# Patient Record
Sex: Female | Born: 1965 | Race: White | Hispanic: Yes | Smoking: Never smoker
Health system: Southern US, Community
[De-identification: ages and names within clinical notes are randomized; demographics above are authoritative.]

## PROBLEM LIST (undated history)

## (undated) DIAGNOSIS — E785 Hyperlipidemia, unspecified: Secondary | ICD-10-CM

## (undated) DIAGNOSIS — R030 Elevated blood-pressure reading, without diagnosis of hypertension: Principal | ICD-10-CM

## (undated) DIAGNOSIS — D649 Anemia, unspecified: Secondary | ICD-10-CM

## (undated) HISTORY — DX: Elevated blood-pressure reading, without diagnosis of hypertension: R03.0

## (undated) HISTORY — PX: TUBAL LIGATION: SHX77

## (undated) HISTORY — PX: LITHOTRIPSY: SUR834

## (undated) HISTORY — DX: Hyperlipidemia, unspecified: E78.5

## (undated) HISTORY — DX: Anemia, unspecified: D64.9

---

## 2004-11-01 ENCOUNTER — Emergency Department (HOSPITAL_COMMUNITY): Admission: EM | Admit: 2004-11-01 | Discharge: 2004-11-01 | Payer: Self-pay | Admitting: Emergency Medicine

## 2004-12-04 ENCOUNTER — Ambulatory Visit: Payer: Self-pay | Admitting: Gastroenterology

## 2005-01-16 ENCOUNTER — Ambulatory Visit: Payer: Self-pay | Admitting: Gastroenterology

## 2005-01-16 ENCOUNTER — Encounter (INDEPENDENT_AMBULATORY_CARE_PROVIDER_SITE_OTHER): Payer: Self-pay | Admitting: Specialist

## 2006-12-02 ENCOUNTER — Encounter (INDEPENDENT_AMBULATORY_CARE_PROVIDER_SITE_OTHER): Payer: Self-pay | Admitting: Nurse Practitioner

## 2006-12-02 ENCOUNTER — Ambulatory Visit: Payer: Self-pay | Admitting: Internal Medicine

## 2006-12-02 LAB — CONVERTED CEMR LAB
ALT: <8 units/L (ref 0–35)
AST: 13 units/L (ref 0–37)
Albumin: 4.5 g/dL (ref 3.5–5.2)
Alkaline Phosphatase: 83 units/L (ref 39–117)
BUN: 9 mg/dL (ref 6–23)
Basophils Absolute: 0 10*3/uL (ref 0.0–0.1)
Basophils Relative: 1 % (ref 0–1)
CO2: 21 meq/L (ref 19–32)
Calcium: 9.4 mg/dL (ref 8.4–10.5)
Chloride: 106 meq/L (ref 96–112)
Creatinine, Ser: 0.6 mg/dL (ref 0.40–1.20)
Eosinophils Absolute: 0.2 10*3/uL (ref 0.2–0.7)
Eosinophils Relative: 4 % (ref 0–5)
Glucose, Bld: 88 mg/dL (ref 70–99)
HCT: 36.8 % (ref 36.0–46.0)
Helicobacter Pylori Antibody-IgG: 5.5 — ABNORMAL HIGH
Hemoglobin: 12 g/dL (ref 12.0–15.0)
Lymphocytes Relative: 32 % (ref 12–46)
Lymphs Abs: 1.9 10*3/uL (ref 0.7–4.0)
MCHC: 32.6 g/dL (ref 30.0–36.0)
MCV: 81.1 fL (ref 78.0–100.0)
Monocytes Absolute: 0.3 10*3/uL (ref 0.1–1.0)
Monocytes Relative: 4 % (ref 3–12)
Neutro Abs: 3.5 10*3/uL (ref 1.7–7.7)
Neutrophils Relative %: 60 % (ref 43–77)
Platelets: 369 10*3/uL (ref 150–400)
Potassium: 4.2 meq/L (ref 3.5–5.3)
Preg, Serum: NEGATIVE
RBC: 4.54 M/uL (ref 3.87–5.11)
RDW: 12.7 % (ref 11.5–15.5)
Sodium: 140 meq/L (ref 135–145)
TSH: 1.564 microintl units/mL (ref 0.350–5.50)
Total Bilirubin: 0.6 mg/dL (ref 0.3–1.2)
Total Protein: 7.8 g/dL (ref 6.0–8.3)
WBC: 5.8 10*3/uL (ref 4.0–10.5)

## 2006-12-05 ENCOUNTER — Emergency Department (HOSPITAL_COMMUNITY): Admission: EM | Admit: 2006-12-05 | Discharge: 2006-12-05 | Payer: Self-pay | Admitting: Emergency Medicine

## 2006-12-21 ENCOUNTER — Ambulatory Visit (HOSPITAL_COMMUNITY): Admission: RE | Admit: 2006-12-21 | Discharge: 2006-12-21 | Payer: Self-pay | Admitting: Internal Medicine

## 2006-12-23 ENCOUNTER — Ambulatory Visit: Payer: Self-pay | Admitting: *Deleted

## 2007-02-23 ENCOUNTER — Encounter (INDEPENDENT_AMBULATORY_CARE_PROVIDER_SITE_OTHER): Payer: Self-pay | Admitting: Nurse Practitioner

## 2007-02-23 ENCOUNTER — Ambulatory Visit: Payer: Self-pay | Admitting: Family Medicine

## 2007-04-14 ENCOUNTER — Ambulatory Visit: Payer: Self-pay | Admitting: Internal Medicine

## 2007-04-22 ENCOUNTER — Ambulatory Visit: Payer: Self-pay | Admitting: Internal Medicine

## 2007-05-11 ENCOUNTER — Ambulatory Visit: Payer: Self-pay | Admitting: Internal Medicine

## 2007-10-06 ENCOUNTER — Ambulatory Visit: Payer: Self-pay | Admitting: Internal Medicine

## 2008-03-22 ENCOUNTER — Ambulatory Visit: Payer: Self-pay | Admitting: Internal Medicine

## 2008-03-28 ENCOUNTER — Ambulatory Visit (HOSPITAL_COMMUNITY): Admission: RE | Admit: 2008-03-28 | Discharge: 2008-03-28 | Payer: Self-pay | Admitting: Internal Medicine

## 2010-06-07 ENCOUNTER — Other Ambulatory Visit (HOSPITAL_COMMUNITY): Payer: Self-pay | Admitting: Family Medicine

## 2010-06-07 ENCOUNTER — Other Ambulatory Visit: Payer: Self-pay | Admitting: Family Medicine

## 2010-06-07 ENCOUNTER — Other Ambulatory Visit (HOSPITAL_COMMUNITY): Admission: RE | Admit: 2010-06-07 | Payer: Self-pay | Source: Ambulatory Visit

## 2010-06-07 DIAGNOSIS — Z1231 Encounter for screening mammogram for malignant neoplasm of breast: Secondary | ICD-10-CM

## 2010-06-19 ENCOUNTER — Ambulatory Visit (HOSPITAL_COMMUNITY): Payer: Self-pay

## 2010-07-02 ENCOUNTER — Ambulatory Visit (HOSPITAL_COMMUNITY): Payer: Self-pay

## 2010-07-18 ENCOUNTER — Ambulatory Visit (HOSPITAL_COMMUNITY): Payer: Self-pay

## 2010-08-01 ENCOUNTER — Ambulatory Visit (HOSPITAL_COMMUNITY)
Admission: RE | Admit: 2010-08-01 | Discharge: 2010-08-01 | Disposition: A | Discharge status: Home / Self Care | Payer: Self-pay | Source: Ambulatory Visit | Attending: Family Medicine | Admitting: Family Medicine

## 2010-08-01 DIAGNOSIS — Z1231 Encounter for screening mammogram for malignant neoplasm of breast: Secondary | ICD-10-CM | POA: Insufficient documentation

## 2010-10-22 LAB — BASIC METABOLIC PANEL
BUN: 10
CO2: 30
Calcium: 9
Chloride: 103
Creatinine, Ser: 0.61
GFR calc Af Amer: >60
GFR calc non Af Amer: >60
Glucose, Bld: 105 — ABNORMAL HIGH
Potassium: 3.4 — ABNORMAL LOW
Sodium: 138

## 2010-10-22 LAB — URINE MICROSCOPIC-ADD ON

## 2010-10-22 LAB — URINALYSIS, ROUTINE W REFLEX MICROSCOPIC
Bilirubin Urine: NEGATIVE
Glucose, UA: NEGATIVE
Ketones, ur: NEGATIVE
Leukocytes, UA: NEGATIVE
Nitrite: NEGATIVE
Protein, ur: NEGATIVE
Specific Gravity, Urine: 1.029
Urobilinogen, UA: 0.2
pH: 6

## 2010-10-22 LAB — PREGNANCY, URINE: Preg Test, Ur: NEGATIVE

## 2011-02-10 ENCOUNTER — Emergency Department (INDEPENDENT_AMBULATORY_CARE_PROVIDER_SITE_OTHER)
Admission: EM | Admit: 2011-02-10 | Discharge: 2011-02-10 | Disposition: A | Discharge status: Home / Self Care | Payer: Self-pay | Source: Home / Self Care | Attending: Emergency Medicine | Admitting: Emergency Medicine

## 2011-02-10 ENCOUNTER — Encounter (HOSPITAL_COMMUNITY): Payer: Self-pay | Admitting: Emergency Medicine

## 2011-02-10 DIAGNOSIS — K5900 Constipation, unspecified: Secondary | ICD-10-CM

## 2011-02-10 DIAGNOSIS — R109 Unspecified abdominal pain: Secondary | ICD-10-CM

## 2011-02-10 MED ORDER — OMEPRAZOLE 20 MG PO CPDR: 40.0000 mg | DELAYED_RELEASE_CAPSULE | Freq: Every day | ORAL | Status: DC

## 2011-02-10 MED ORDER — POLYETHYLENE GLYCOL 3350 17 G PO PACK: 17.0000 g | PACK | Freq: Every day | ORAL | Status: AC

## 2011-02-10 MED ORDER — POLYETHYLENE GLYCOL 3350 17 G PO PACK: 17.0000 g | PACK | Freq: Every day | ORAL | Status: DC

## 2011-02-10 NOTE — ED Provider Notes (Signed)
History     CSN: [id number]  Arrival date & time 02/10/11  1721   First MD Initiated Contact with Patient 02/10/11 1722      Chief Complaint  Patient presents with  . Abdominal Pain    (Consider location/radiation/quality/duration/timing/severity/associated sxs/prior treatment) HPI Comments: Been having stomach pain on and off, for several weeks" also with hard stools last BM was yesterday, the pain comes and goes, been taking some Motrins for my shoulder pain" feeling nauseous when I eat, no vomiting, no diarrheas  Patient is a 46 y.o. female presenting with abdominal pain. The history is provided by the patient.  Abdominal Pain The primary symptoms of the illness include abdominal pain and nausea. The primary symptoms of the illness do not include fever, fatigue, shortness of breath, vomiting, diarrhea or dysuria. The current episode started more than 2 days ago. The onset of the illness was sudden. The problem has not changed since onset. The patient states that she believes she is currently not pregnant. The patient has not had a change in bowel habit. Additional symptoms associated with the illness include anorexia and constipation. Symptoms associated with the illness do not include diaphoresis, heartburn, hematuria, frequency or back pain. Significant associated medical issues include GERD. Significant associated medical issues do not include inflammatory bowel disease.    History reviewed. No pertinent past medical history.  History reviewed. No pertinent past surgical history.  No family history on file.  History  Substance Use Topics  . Smoking status: Not on file  . Smokeless tobacco: Not on file  . Alcohol Use: Not on file    OB History    Grav Para Term Preterm Abortions TAB SAB Ect Mult Living                  Review of Systems  Constitutional: Positive for appetite change. Negative for fever, diaphoresis and fatigue.  Respiratory: Negative for shortness of  breath.   Gastrointestinal: Positive for nausea, abdominal pain, constipation and anorexia. Negative for heartburn, vomiting and diarrhea.  Genitourinary: Negative for dysuria, frequency and hematuria.  Musculoskeletal: Negative for back pain.    Allergies  Review of patient's allergies indicates no known allergies.  Home Medications   Current Outpatient Rx  Name Route Sig Dispense Refill  . OMEPRAZOLE 20 MG PO CPDR Oral Take 2 capsules (40 mg total) by mouth daily. 30 capsule 0  . POLYETHYLENE GLYCOL 3350 PO PACK Oral Take 17 g by mouth daily. X 5 nights 14 each 0    BP 154/82  Pulse 66  Temp(Src) 98.3 F (36.8 C) (Oral)  Resp 16  SpO2 100%  LMP 02/10/2011  Physical Exam  Constitutional: She appears well-developed and well-nourished. No distress.  HENT:  Mouth/Throat: No oropharyngeal exudate.  Eyes: No scleral icterus.  Neck: Neck supple.  Abdominal: Soft. Bowel sounds are normal. She exhibits no shifting dullness, no distension and no mass. There is no hepatosplenomegaly or hepatomegaly. There is generalized tenderness. There is no rigidity, no rebound, no guarding, no CVA tenderness, no tenderness at McBurney's point and negative Murphy's sign. No hernia.  Skin: Skin is warm. No erythema.    ED Course  Procedures (including critical care time)  Labs Reviewed - No data to display No results found.   1. Abdominal pain   2. Constipated       MDM  ABDOMINAL PIAN X 5 DAYS, EPIGASTRIC AND CONSTIPATED- ADMITS TO TAKEN MOTRIN 800 FOR SHOULDER PAIN- ALSO WITH CONSTIPATION- SOFT ABDOMEN AND  AFEBRILE.        Jimmie Molly, MD 02/10/11 [phone]

## 2011-02-10 NOTE — ED Notes (Signed)
Pt having stomach pain that mostly appears epigastric. She states she feels full from breakfast still and it makes her nauseas. She has had stomach problems in the past for which she had an endoscopy done. Last BM was yesterday. She is currently menstruating.

## 2011-02-15 ENCOUNTER — Encounter (HOSPITAL_COMMUNITY): Payer: Self-pay | Admitting: Family Medicine

## 2011-02-15 ENCOUNTER — Emergency Department (HOSPITAL_COMMUNITY)
Admission: EM | Admit: 2011-02-15 | Discharge: 2011-02-15 | Disposition: A | Discharge status: Home / Self Care | Payer: Self-pay | Attending: Emergency Medicine | Admitting: Emergency Medicine

## 2011-02-15 ENCOUNTER — Emergency Department (HOSPITAL_COMMUNITY): Payer: Self-pay

## 2011-02-15 DIAGNOSIS — R630 Anorexia: Secondary | ICD-10-CM | POA: Insufficient documentation

## 2011-02-15 DIAGNOSIS — R1013 Epigastric pain: Secondary | ICD-10-CM | POA: Insufficient documentation

## 2011-02-15 DIAGNOSIS — K2970 Gastritis, unspecified, without bleeding: Secondary | ICD-10-CM | POA: Insufficient documentation

## 2011-02-15 DIAGNOSIS — R10816 Epigastric abdominal tenderness: Secondary | ICD-10-CM | POA: Insufficient documentation

## 2011-02-15 DIAGNOSIS — K2990 Gastroduodenitis, unspecified, without bleeding: Secondary | ICD-10-CM | POA: Insufficient documentation

## 2011-02-15 DIAGNOSIS — R112 Nausea with vomiting, unspecified: Secondary | ICD-10-CM | POA: Insufficient documentation

## 2011-02-15 DIAGNOSIS — K5900 Constipation, unspecified: Secondary | ICD-10-CM | POA: Insufficient documentation

## 2011-02-15 LAB — CBC
HCT: 36 % (ref 36.0–46.0)
Hemoglobin: 12.1 g/dL (ref 12.0–15.0)
MCH: 26.5 pg (ref 26.0–34.0)
MCHC: 33.6 g/dL (ref 30.0–36.0)
Platelets: 321 10*3/uL (ref 150–400)
RBC: 4.57 MIL/uL (ref 3.87–5.11)
RDW: 12.5 % (ref 11.5–15.5)
WBC: 7.5 10*3/uL (ref 4.0–10.5)

## 2011-02-15 LAB — URINALYSIS, MICROSCOPIC ONLY
Glucose, UA: NEGATIVE mg/dL
Ketones, ur: NEGATIVE mg/dL
Leukocytes, UA: NEGATIVE
Nitrite: NEGATIVE
Specific Gravity, Urine: 1.021 (ref 1.005–1.030)

## 2011-02-15 LAB — DIFFERENTIAL
Basophils Absolute: 0 10*3/uL (ref 0.0–0.1)
Basophils Relative: 0 % (ref 0–1)
Eosinophils Relative: 6 % — ABNORMAL HIGH (ref 0–5)
Lymphocytes Relative: 38 % (ref 12–46)
Lymphs Abs: 2.8 10*3/uL (ref 0.7–4.0)
Monocytes Absolute: 0.5 10*3/uL (ref 0.1–1.0)
Monocytes Relative: 6 % (ref 3–12)
Neutro Abs: 3.8 10*3/uL (ref 1.7–7.7)
Neutrophils Relative %: 50 % (ref 43–77)

## 2011-02-15 LAB — COMPREHENSIVE METABOLIC PANEL
ALT: 5 U/L (ref 0–35)
AST: 16 U/L (ref 0–37)
Albumin: 3.5 g/dL (ref 3.5–5.2)
BUN: 11 mg/dL (ref 6–23)
CO2: 23 mEq/L (ref 19–32)
Calcium: 9.2 mg/dL (ref 8.4–10.5)
Chloride: 102 mEq/L (ref 96–112)
GFR calc Af Amer: >90 mL/min (ref >90)
GFR calc non Af Amer: >90 mL/min (ref >90)
Potassium: 3.5 mEq/L (ref 3.5–5.1)
Sodium: 136 mEq/L (ref 135–145)
Total Bilirubin: 0.4 mg/dL (ref 0.3–1.2)
Total Protein: 7.7 g/dL (ref 6.0–8.3)

## 2011-02-15 LAB — LIPASE, BLOOD: Lipase: 31 U/L (ref 11–59)

## 2011-02-15 MED ORDER — FAMOTIDINE IN NACL 20-0.9 MG/50ML-% IV SOLN
20.0000 mg | Freq: Once | INTRAVENOUS | Status: AC
  Administered 2011-02-15: 20 mg via INTRAVENOUS
  Filled 2011-02-15: qty 50

## 2011-02-15 MED ORDER — PROMETHAZINE HCL 25 MG PO TABS: 25.0000 mg | ORAL_TABLET | Freq: Four times a day (QID) | ORAL | Status: AC | PRN

## 2011-02-15 MED ORDER — ONDANSETRON HCL 4 MG/2ML IJ SOLN
4.0000 mg | Freq: Once | INTRAMUSCULAR | Status: AC
  Administered 2011-02-15: 4 mg via INTRAVENOUS
  Filled 2011-02-15: qty 2

## 2011-02-15 MED ORDER — MAGNESIUM CITRATE PO SOLN
1.0000 | Freq: Once | ORAL | Status: DC
  Filled 2011-02-15: qty 296

## 2011-02-15 MED ORDER — SODIUM CHLORIDE 0.9 % IV BOLUS (SEPSIS)
1000.0000 mL | Freq: Once | INTRAVENOUS | Status: AC
  Administered 2011-02-15: 1000 mL via INTRAVENOUS

## 2011-02-15 NOTE — ED Notes (Signed)
Pt complaining of abdominal pain for 2 weeks. sts vomiting everytime she eats. sts feels like food wont pass.

## 2011-02-15 NOTE — ED Provider Notes (Signed)
History     CSN: [id number]  Arrival date & time 02/15/11  1500   First MD Initiated Contact with Patient 02/15/11 1540      Chief Complaint  Patient presents with  . Abdominal Pain    (Consider location/radiation/quality/duration/timing/severity/associated sxs/prior treatment) Patient is a 46 y.o. female presenting with abdominal pain. The history is provided by the patient.  Abdominal Pain The primary symptoms of the illness include abdominal pain, nausea and vomiting. The primary symptoms of the illness do not include shortness of breath, diarrhea, dysuria, vaginal discharge or vaginal bleeding. Episode onset: 2 weeks ago. The onset of the illness was gradual. The problem has been gradually worsening.  The abdominal pain is located in the epigastric region. The abdominal pain does not radiate. The severity of the abdominal pain is 4/10. The abdominal pain is relieved by nothing. The abdominal pain is exacerbated by eating.  The vomiting began yesterday. Vomiting occurred once. The emesis contains stomach contents.  The illness is associated with eating. The patient states that she believes she is currently not pregnant. The patient has had a change in bowel habit. Additional symptoms associated with the illness include anorexia and constipation. Significant associated medical issues do not include PUD, GERD, gallstones, liver disease, substance abuse or cardiac disease.    History reviewed. No pertinent past medical history.  Past Surgical History  Procedure Date  . Cesarean section     History reviewed. No pertinent family history.  History  Substance Use Topics  . Smoking status: Never Smoker   . Smokeless tobacco: Not on file  . Alcohol Use: No    OB History    Grav Para Term Preterm Abortions TAB SAB Ect Mult Living                  Review of Systems  Respiratory: Negative for shortness of breath.   Gastrointestinal: Positive for nausea, vomiting, abdominal pain,  constipation and anorexia. Negative for diarrhea.  Genitourinary: Negative for dysuria, vaginal bleeding and vaginal discharge.  All other systems reviewed and are negative.    Allergies  Review of patient's allergies indicates no known allergies.  Home Medications   Current Outpatient Rx  Name Route Sig Dispense Refill  . OMEPRAZOLE 20 MG PO CPDR Oral Take 2 capsules (40 mg total) by mouth daily. 30 capsule 0    BP 144/84  Pulse 74  Temp(Src) 98.2 F (36.8 C) (Oral)  Resp 18  SpO2 100%  LMP 02/10/2011  Physical Exam  Nursing note and vitals reviewed. Constitutional: She is oriented to person, place, and time. She appears well-developed and well-nourished. She appears distressed.  HENT:  Head: Normocephalic and atraumatic.  Mouth/Throat: Oropharynx is clear and moist.  Eyes: EOM are normal. Pupils are equal, round, and reactive to light.  Cardiovascular: Normal rate, regular rhythm, normal heart sounds and intact distal pulses.  Exam reveals no friction rub.   No murmur heard. Pulmonary/Chest: Effort normal and breath sounds normal. She has no wheezes. She has no rales.  Abdominal: Soft. Bowel sounds are normal. She exhibits no distension. There is tenderness in the epigastric area. There is no rebound, no guarding and no CVA tenderness.       Mild epigastric tenderness with no rebound or guarding  Musculoskeletal: Normal range of motion. She exhibits no tenderness.       No edema  Neurological: She is alert and oriented to person, place, and time. No cranial nerve deficit.  Skin: Skin is warm  and dry. No rash noted.  Psychiatric: She has a normal mood and affect. Her behavior is normal.    ED Course  Procedures (including critical care time)  Labs Reviewed  URINALYSIS, WITH MICROSCOPIC - Abnormal; Notable for the following:    Hgb urine dipstick SMALL (*)    Bacteria, UA FEW (*)    Squamous Epithelial / LPF MANY (*)    All other components within normal limits    DIFFERENTIAL - Abnormal; Notable for the following:    Eosinophils Relative 6 (*)    All other components within normal limits  CBC  COMPREHENSIVE METABOLIC PANEL  LIPASE, BLOOD  POCT PREGNANCY, URINE   Dg Abd Acute W/chest  02/15/2011  *RADIOLOGY REPORT*  Clinical Data: Abdominal pain and vomiting.  ACUTE ABDOMEN SERIES (ABDOMEN 2 VIEW & CHEST 1 VIEW)  Comparison: 11/01/2004  Findings: Heart and lungs appear normal.  No free air or free fluid in the abdomen.  Bowel gas pattern is normal.  Several radiodense tablets are noted in the bowel.  No osseous abnormality.  IMPRESSION: Benign-appearing abdomen and chest.  Original Report Authenticated By: Gwynn Burly, M.D.     1. Constipation   2. Gastritis       MDM   Patient had intermittent abdominal pain for the last 2 weeks and nausea. The last time she vomited was yesterday. She has mild epigastric pain on exam but no peritoneal findings. She states her last bowel movement was over 2 weeks ago. She was seen at urgent care on January 28 and diagnosed with constipation but she states she still has not gone to the bathroom and is scared of vomiting. She has no right upper quadrant pain suggestive of coal lead cholelithiasis or cholecystitis. She is hydrated and well appearing with no signs of dehydration and her pulses within normal limits. When speaking with the patient she does use NSAIDs frequently for back pain and shoulder pain. She also takes Prilosec. However she recently just started the Prilosec on January 28. CBC, CMP, lipase, UA, UPT all within normal limits. Acute abdominal series negative for obstruction or free air. Patient given IV fluids, Zofran, Pepcid. Feel most likely patient has constipation and gastritis. she is tolerating by mouth's and discharge home with laxatives.       Gwyneth Sprout, MD 02/16/11 [phone]

## 2011-04-14 ENCOUNTER — Encounter (HOSPITAL_COMMUNITY): Payer: Self-pay | Admitting: Emergency Medicine

## 2011-04-14 ENCOUNTER — Emergency Department (HOSPITAL_COMMUNITY)
Admission: EM | Admit: 2011-04-14 | Discharge: 2011-04-14 | Disposition: A | Discharge status: Home / Self Care | Payer: Self-pay | Attending: Emergency Medicine | Admitting: Emergency Medicine

## 2011-04-14 DIAGNOSIS — M79609 Pain in unspecified limb: Secondary | ICD-10-CM | POA: Insufficient documentation

## 2011-04-14 DIAGNOSIS — M7918 Myalgia, other site: Secondary | ICD-10-CM

## 2011-04-14 DIAGNOSIS — M25519 Pain in unspecified shoulder: Secondary | ICD-10-CM | POA: Insufficient documentation

## 2011-04-14 DIAGNOSIS — R0789 Other chest pain: Secondary | ICD-10-CM | POA: Insufficient documentation

## 2011-04-14 MED ORDER — HYDROCODONE-ACETAMINOPHEN 5-325 MG PO TABS: 2.0000 | ORAL_TABLET | ORAL | Status: AC | PRN

## 2011-04-14 MED ORDER — PREDNISONE 10 MG PO TABS: 20.0000 mg | ORAL_TABLET | Freq: Every day | ORAL | Status: DC

## 2011-04-14 MED ORDER — METHOCARBAMOL 500 MG PO TABS: 500.0000 mg | ORAL_TABLET | Freq: Two times a day (BID) | ORAL | Status: AC

## 2011-04-14 NOTE — ED Notes (Signed)
Pt.stated, I have lt. Arm pain and can't sleep it comes and goes, when it comes it goes to my neck and to my chest.

## 2011-04-14 NOTE — ED Notes (Signed)
Patient with left arm and shoulder pain x 1 week.  Patient states pain with Arm movement and radiates in to armpit and into upper left chest and left neck.

## 2011-04-14 NOTE — Discharge Instructions (Signed)
Musculoskeletal Pain   Musculoskeletal pain is muscle and boney aches and pains. These pains can occur in any part of the body. Your caregiver may treat you without knowing the cause of the pain. They may treat you if blood or urine tests, X-rays, and other tests were normal.   CAUSES   There is often not a definite cause or reason for these pains. These pains may be caused by a type of germ (virus). The discomfort may also come from overuse. Overuse includes working out too hard when your body is not fit. Boney aches also come from weather changes. Bone is sensitive to atmospheric pressure changes.   HOME CARE INSTRUCTIONS   Ask when your test results will be ready. Make sure you get your test results.   Only take over-the-counter or prescription medicines for pain, discomfort, or fever as directed by your caregiver. If you were given medications for your condition, do not drive, operate machinery or power tools, or sign legal documents for 24 hours. Do not drink alcohol. Do not take sleeping pills or other medications that may interfere with treatment.   Continue all activities unless the activities cause more pain. When the pain lessens, slowly resume normal activities. Gradually increase the intensity and duration of the activities or exercise.   During periods of severe pain, bed rest may be helpful. Lay or sit in any position that is comfortable.   Putting ice on the injured area.   Put ice in a bag.   Place a towel between your skin and the bag.   Leave the ice on for 15 to 20 minutes, 3 to 4 times a day.   Follow up with your caregiver for continued problems and no reason can be found for the pain. If the pain becomes worse or does not go away, it may be necessary to repeat tests or do additional testing. Your caregiver may need to look further for a possible cause.   SEEK IMMEDIATE MEDICAL CARE IF:   You have pain that is getting worse and is not relieved by medications.   You develop chest pain that is  associated with shortness or breath, sweating, feeling sick to your stomach (nauseous), or throw up (vomit).   Your pain becomes localized to the abdomen.   You develop any new symptoms that seem different or that concern you.   MAKE SURE YOU:   Understand these instructions.   Will watch your condition.   Will get help right away if you are not doing well or get worse.   Document Released: 12/30/2004 Document Revised: 12/19/2010 Document Reviewed: 08/20/2007   [facility] Patient Information 2012 [place], [place].

## 2011-04-14 NOTE — ED Provider Notes (Signed)
History     CSN: [id number]  Arrival date & time 04/14/11  1207   First MD Initiated Contact with Patient 04/14/11 1315      Chief Complaint  Patient presents with  . Arm Pain    (Consider location/radiation/quality/duration/timing/severity/associated sxs/prior treatment) Patient is a 46 y.o. female presenting with arm pain. The history is provided by the patient.  Arm Pain   Patient presents with left arm pain that radiates into her left neck and across her chest for the past one month and worse for one week. 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 She has been using ibuprofen with minimal relief. Denies any shortness of breath dyspnea or diaphoresis. Does have some neck pain with this. No recent history of direct trauma. No prior surgical history to her shoulder History reviewed. No pertinent past medical history.  Past Surgical History  Procedure Date  . Cesarean section     No family history on file.  History  Substance Use Topics  . Smoking status: Never Smoker   . Smokeless tobacco: Not on file  . Alcohol Use: No    OB History    Grav Para Term Preterm Abortions TAB SAB Ect Mult Living                  Review of Systems  All other systems reviewed and are negative.    Allergies  Review of patient's allergies indicates no known allergies.  Home Medications   Current Outpatient Rx  Name Route Sig Dispense Refill  . IBUPROFEN 200 MG PO TABS Oral Take 400 mg by mouth every 6 (six) hours as needed. For pain      BP 126/102  Pulse 76  Temp 98 F (36.7 C)  Resp 16  SpO2 100%  LMP 03/30/2011  Physical Exam  Nursing note and vitals reviewed. Constitutional: She is oriented to person, place, and time. She appears well-developed and well-nourished.  Non-toxic appearance. No distress.  HENT:  Head: Normocephalic and atraumatic.  Eyes: Conjunctivae, EOM and lids are normal. Pupils are equal, round, and  reactive to light.  Neck: Normal range of motion. Neck supple. No tracheal deviation present. No mass present.  Cardiovascular: Normal rate, regular rhythm and normal heart sounds.  Exam reveals no gallop.   No murmur heard. Pulmonary/Chest: Effort normal and breath sounds normal. No stridor. No respiratory distress. She has no decreased breath sounds. She has no wheezes. She has no rhonchi. She has no rales.  Abdominal: Soft. Normal appearance and bowel sounds are normal. She exhibits no distension. There is no tenderness. There is no rebound and no CVA tenderness.  Musculoskeletal: Normal range of motion. She exhibits no edema and no tenderness.       Pain to palpation at the left upper trapezius muscle and left upper anterior chest extending to the left anterior deltoid  Neurological: She is alert and oriented to person, place, and time. She has normal strength. No cranial nerve deficit or sensory deficit. GCS eye subscore is 4. GCS verbal subscore is 5. GCS motor subscore is 6.  Skin: Skin is warm and dry. No abrasion and no rash noted.  Psychiatric: She has a normal mood and affect. Her speech is normal and behavior is normal.    ED Course  Procedures (including critical care time)  Labs Reviewed - No data to display No results found.   No diagnosis found.    MDM  Patient's pain is musculoskeletal we'll place on prednisone and given muscle relaxants.        Toy Baker, MD 04/14/11 1326

## 2012-08-30 ENCOUNTER — Ambulatory Visit (INDEPENDENT_AMBULATORY_CARE_PROVIDER_SITE_OTHER): Payer: Managed Care, Other (non HMO) | Admitting: Gynecology

## 2012-08-30 ENCOUNTER — Encounter: Payer: Self-pay | Admitting: Gynecology

## 2012-08-30 VITALS — BP 112/70 | Ht <= 58 in | Wt 126.0 lb

## 2012-08-30 DIAGNOSIS — N920 Excessive and frequent menstruation with regular cycle: Secondary | ICD-10-CM

## 2012-08-30 DIAGNOSIS — Z862 Personal history of diseases of the blood and blood-forming organs and certain disorders involving the immune mechanism: Secondary | ICD-10-CM

## 2012-08-30 DIAGNOSIS — N898 Other specified noninflammatory disorders of vagina: Secondary | ICD-10-CM

## 2012-08-30 DIAGNOSIS — Z01419 Encounter for gynecological examination (general) (routine) without abnormal findings: Secondary | ICD-10-CM

## 2012-08-30 DIAGNOSIS — Z23 Encounter for immunization: Secondary | ICD-10-CM

## 2012-08-30 LAB — CBC WITH DIFFERENTIAL/PLATELET
Hemoglobin: 10.8 g/dL — ABNORMAL LOW (ref 12.0–15.0)
Lymphocytes Relative: 33 % (ref 12–46)
Lymphs Abs: 2.2 10*3/uL (ref 0.7–4.0)
MCV: 78.4 fL (ref 78.0–100.0)
Neutrophils Relative %: 55 % (ref 43–77)
Platelets: 268 10*3/uL (ref 150–400)
RBC: 4.17 MIL/uL (ref 3.87–5.11)
WBC: 6.6 10*3/uL (ref 4.0–10.5)

## 2012-08-30 LAB — COMPREHENSIVE METABOLIC PANEL
ALT: <8 U/L (ref 0–35)
Albumin: 4 g/dL (ref 3.5–5.2)
CO2: 28 mEq/L (ref 19–32)
Calcium: 8.6 mg/dL (ref 8.4–10.5)
Chloride: 104 mEq/L (ref 96–112)
Sodium: 137 mEq/L (ref 135–145)
Total Protein: 6.4 g/dL (ref 6.0–8.3)

## 2012-08-30 LAB — WET PREP FOR TRICH, YEAST, CLUE: Clue Cells Wet Prep HPF POC: NONE SEEN

## 2012-08-30 LAB — TSH: TSH: 1.926 u[IU]/mL (ref 0.350–4.500)

## 2012-08-30 MED ORDER — TRANEXAMIC ACID 650 MG PO TABS: 1300.0000 mg | ORAL_TABLET | Freq: Three times a day (TID) | ORAL | Status: DC

## 2012-08-30 NOTE — Progress Notes (Signed)
Colleen Bailey Mar 26, 1965 [id number]   History:    47 y.o.  for annual gyn exam new patient to the practice with several complaints. She states that her periods are lasting 7-10 days and are very heavy but not much cramping. Patient has been pregnant 3 times of which 2 were delivered vaginally one by cesarean section. She has had tubal sterilization in the past. Her last gynecological examination was in 2012. Patient denies any prior history of abnormal Pap smears. Her last mammogram was normal in 2012 a dense breast were described. Patient states that right after her menses she has a slight discharge. She is not sexually active.  Past medical history,surgical history, family history and social history were all reviewed and documented in the EPIC chart.  Gynecologic History Patient's last menstrual period was 08/18/2012. Contraception: tubal ligation Last Pap: 2012. Results were: normal Last mammogram: 2012. Results were: dense but normal  Obstetric History OB History  Gravida Para Term Preterm AB SAB TAB Ectopic Multiple Living  5 3   2  2   3     # Outcome Date GA Lbr Len/2nd Weight Sex Delivery Anes PTL Lv  5 TAB           4 TAB           3 PAR           2 PAR           1 PAR                ROS: A ROS was performed and pertinent positives and negatives are included in the history.  GENERAL: No fevers or chills. HEENT: No change in vision, no earache, sore throat or sinus congestion. NECK: No pain or stiffness. CARDIOVASCULAR: No chest pain or pressure. No palpitations. PULMONARY: No shortness of breath, cough or wheeze. GASTROINTESTINAL: No abdominal pain, nausea, vomiting or diarrhea, melena or bright red blood per rectum. GENITOURINARY: No urinary frequency, urgency, hesitancy or dysuria. MUSCULOSKELETAL: No joint or muscle pain, no back pain, no recent trauma. DERMATOLOGIC: No rash, no itching, no lesions. ENDOCRINE: No polyuria, polydipsia, no heat or cold intolerance. No recent  change in weight. HEMATOLOGICAL: No anemia or easy bruising or bleeding. NEUROLOGIC: No headache, seizures, numbness, tingling or weakness. PSYCHIATRIC: No depression, no loss of interest in normal activity or change in sleep pattern.     Exam: chaperone present  BP 112/70  Ht 4\' 10"  (1.473 m)  Wt 126 lb (57.153 kg)  BMI 26.34 kg/m2  LMP 08/18/2012  Body mass index is 26.34 kg/(m^2).  General appearance : Well developed well nourished female. No acute distress HEENT: Neck supple, trachea midline, no carotid bruits, no thyroidmegaly Lungs: Clear to auscultation, no rhonchi or wheezes, or rib retractions  Heart: Regular rate and rhythm, no murmurs or gallops Breast:Examined in sitting and supine position were symmetrical in appearance, no palpable masses or tenderness,  no skin retraction, no nipple inversion, no nipple discharge, no skin discoloration, no axillary or supraclavicular lymphadenopathy Abdomen: no palpable masses or tenderness, no rebound or guarding Extremities: no edema or skin discoloration or tenderness  Pelvic:  Bartholin, Urethra, Skene Glands: Within normal limits             Vagina: No gross lesions or discharge  Cervix: No gross lesions or discharge  Uterus  anteverted, normal size, shape and consistency, non-tender and mobile  Adnexa  Without masses or tenderness  Anus and perineum  normal   Rectovaginal  normal sphincter tone without palpated masses or tenderness             Hemoccult that indicated   Wet prep negative  Assessment/Plan:  48 y.o. female for annual exam with menorrhagia lasting 7-10 days. Patient with some cramping during pelvic examination. We are going to have to patient return to the office in one week for an ultrasound for better assessment of her uterus and ovaries. We had discussed different treatment options for her menorrhagia to include Mirena IUD or Lysteda 2 650 mg tablets 3 times a day for 5 days for her menorrhagia. The follow labs  will be ordered today: CBC, screen cholesterol, comprehensive metabolic panel, and urinalysis. Pap smear not done today in accordance with the new guidelines. We'll discuss further management option when she returns to the office for ultrasound. She was also given a requisition for her to schedule her 3 the mammogram.    Ok Edwards MD, 3:20 PM 08/30/2012

## 2012-08-30 NOTE — Patient Instructions (Addendum)
Tetanus, Diphtheria, Pertussis (Tdap) Vaccine What You Need to Know WHY GET VACCINATED? Tetanus, diphtheria and pertussis can be very serious diseases, even for adolescents and adults. Tdap vaccine can protect us from these diseases. TETANUS (Lockjaw) causes painful muscle tightening and stiffness, usually all over the body.  It can lead to tightening of muscles in the head and neck so you can't open your mouth, swallow, or sometimes even breathe. Tetanus kills about 1 out of 5 people who are infected. DIPHTHERIA can cause a thick coating to form in the back of the throat.  It can lead to breathing problems, paralysis, heart failure, and death. PERTUSSIS (Whooping Cough) causes severe coughing spells, which can cause difficulty breathing, vomiting and disturbed sleep.  It can also lead to weight loss, incontinence, and rib fractures. Up to 2 in 100 adolescents and 5 in 100 adults with pertussis are hospitalized or have complications, which could include pneumonia and death. These diseases are caused by bacteria. Diphtheria and pertussis are spread from person to person through coughing or sneezing. Tetanus enters the body through cuts, scratches, or wounds. Before vaccines, the [place] saw as many as 200,000 cases a year of diphtheria and pertussis, and hundreds of cases of tetanus. Since vaccination began, tetanus and diphtheria have dropped by about 99% and pertussis by about 80%. TDAP VACCINE Tdap vaccine can protect adolescents and adults from tetanus, diphtheria, and pertussis. One dose of Tdap is routinely given at age 11 or 12. People who did not get Tdap at that age should get it as soon as possible. Tdap is especially important for health care professionals and anyone having close contact with a baby younger than 12 months. Pregnant women should get a dose of Tdap during every pregnancy, to protect the newborn from pertussis. Infants are most at risk for severe, life-threatening  complications from pertussis. A similar vaccine, called Td, protects from tetanus and diphtheria, but not pertussis. A Td booster should be given every 10 years. Tdap may be given as one of these boosters if you have not already gotten a dose. Tdap may also be given after a severe cut or burn to prevent tetanus infection. Your doctor can give you more information. Tdap may safely be given at the same time as other vaccines. SOME PEOPLE SHOULD NOT GET THIS VACCINE  If you ever had a life-threatening allergic reaction after a dose of any tetanus, diphtheria, or pertussis containing vaccine, OR if you have a severe allergy to any part of this vaccine, you should not get Tdap. Tell your doctor if you have any severe allergies.  If you had a coma, or long or multiple seizures within 7 days after a childhood dose of DTP or DTaP, you should not get Tdap, unless a cause other than the vaccine was found. You can still get Td.  Talk to your doctor if you:  have epilepsy or another nervous system problem,  had severe pain or swelling after any vaccine containing diphtheria, tetanus or pertussis,  ever had Guillain-Barr Syndrome (GBS),  aren't feeling well on the day the shot is scheduled. RISKS OF A VACCINE REACTION With any medicine, including vaccines, there is a chance of side effects. These are usually mild and go away on their own, but serious reactions are also possible. Brief fainting spells can follow a vaccination, leading to injuries from falling. Sitting or lying down for about 15 minutes can help prevent these. Tell your doctor if you feel dizzy or light-headed, or   have vision changes or ringing in the ears. Mild problems following Tdap (Did not interfere with activities)  Pain where the shot was given (about 3 in 4 adolescents or 2 in 3 adults)  Redness or swelling where the shot was given (about 1 person in 5)  Mild fever of at least 100.36F (up to about 1 in 25 adolescents or 1 in  100 adults)  Headache (about 3 or 4 people in 10)  Tiredness (about 1 person in 3 or 4)  Nausea, vomiting, diarrhea, stomach ache (up to 1 in 4 adolescents or 1 in 10 adults)  Chills, body aches, sore joints, rash, swollen glands (uncommon) Moderate problems following Tdap (Interfered with activities, but did not require medical attention)  Pain where the shot was given (about 1 in 5 adolescents or 1 in 100 adults)  Redness or swelling where the shot was given (up to about 1 in 16 adolescents or 1 in 25 adults)  Fever over 102F (about 1 in 100 adolescents or 1 in 250 adults)  Headache (about 3 in 20 adolescents or 1 in 10 adults)  Nausea, vomiting, diarrhea, stomach ache (up to 1 or 3 people in 100)  Swelling of the entire arm where the shot was given (up to about 3 in 100). Severe problems following Tdap (Unable to perform usual activities, required medical attention)  Swelling, severe pain, bleeding and redness in the arm where the shot was given (rare). A severe allergic reaction could occur after any vaccine (estimated less than 1 in a million doses). WHAT IF THERE IS A SERIOUS REACTION? What should I look for?  Look for anything that concerns you, such as signs of a severe allergic reaction, very high fever, or behavior changes. Signs of a severe allergic reaction can include hives, swelling of the face and throat, difficulty breathing, a fast heartbeat, dizziness, and weakness. These would start a few minutes to a few hours after the vaccination. What should I do?  If you think it is a severe allergic reaction or other emergency that can't wait, call 9-1-1 or get the person to the nearest hospital. Otherwise, call your doctor.  Afterward, the reaction should be reported to the "Vaccine Adverse Event Reporting System" (VAERS). Your doctor might file this report, or you can do it yourself through the VAERS web site at www.vaers.LAgents.no, or by calling 1-580-687-6400. VAERS is  only for reporting reactions. They do not give medical advice.  THE NATIONAL VACCINE INJURY COMPENSATION PROGRAM The National Vaccine Injury Compensation Program (VICP) is a federal program that was created to compensate people who may have been injured by certain vaccines. Persons who believe they may have been injured by a vaccine can learn about the program and about filing a claim by calling 1-205-569-6417 or visiting the VICP website at SpiritualWord.at. HOW CAN I LEARN MORE?  Ask your doctor.  Call your local or state health department.  Contact the Centers for Disease Control and Prevention (CDC):  Call 510-794-6383 or visit CDC's website at PicCapture.uy. CDC Tdap Vaccine VIS (05/22/11) Document Released: 07/01/2011 Document Revised: 09/24/2011 Document Reviewed: 07/01/2011 ExitCare Patient Information 2014 [place], [place]. Tranexamic acid oral tablets Qu es este medicamento? El CIDO [employer] reduce o detiene el proceso mediante el cual los cogulos sanguneos se descomponen. Este medicamento se [place] para tratar los [employer]. Este medicamento puede ser utilizado para otros usos; si tiene alguna pregunta consulte con su proveedor de atencin mdica o con su farmacutico. Qu le debo informar  a mi profesional de la salud antes de tomar este medicamento? Necesita saber si usted presenta alguno de los siguientes problemas o situaciones: -sangrado en el cerebro -problemas de coagulacin -enfermedad renal -problemas de visin  -una reaccin alrgica o inusual al cido tranexmico, a otros medicamentos, alimentos, colorantes o conservantes -si est embarazada o buscando quedar embarazada -si est amamantando a un beb Cmo debo utilizar este medicamento? Tome este medicamento por va oral con un vaso de agua. Siga las instrucciones de la etiqueta del [place]. No corte, triture ni mstique este medicamento. Puede tomarlo con  o sin alimentos. Si le produce malestar estomacal, tmelo con alimentos. Tome sus dosis a intervalos regulares. No tome su medicamento con una frecuencia mayor a la indicada. No deje de tomarlo excepto si as lo indica su mdico. No tome este medicamento hasta que empieza su perodo. No lo tome durante ms de 5 das en seguidos. No tome este medicamento mientras no tiene su perodo. Hable con su pediatra para informarse acerca del uso de este medicamento en nios. Puede requerir atencin especial. Sobredosis: Pngase en contacto inmediatamente con un centro toxicolgico o una sala de urgencia si usted cree que haya tomado demasiado medicamento. ATENCIN: [employer] es solo para usted. No comparta este medicamento con nadie. Qu sucede si me olvido de una dosis? Si olvida una dosis, tmela cuando se recuerde y tome la prxima dosis por lo menos 6 horas despus. No tome ms de 2 tabletas a la vez para compensar las dosis olvidadas. Qu puede interactuar con este medicamento? -ciertos medicamentos usados para ayudar a que la sangre coagule o romper los cogulos de sangre -ciertos medicamentos usados para tratar la leucemia -hormonas femeninas, como estrgenos o progestinas y pldoras, parches, anillos o inyecciones anticonceptivas Puede ser que esta lista no menciona todas las posibles interacciones. Informe a su profesional de [employer] de [employer] Inc productos a base de hierbas, medicamentos de [place] o suplementos nutritivos que est tomando. Si usted fuma, consume bebidas alcohlicas o si utiliza drogas ilegales, indqueselo tambin a su profesional de [employer]. Algunas sustancias pueden interactuar con su medicamento. A qu debo estar atento al usar PPL Corporation? Informe a su mdico o su profesional de la salud si sus sntomas no comienzan a mejorar o si empeoran. Informe a su mdico o su profesional de la salud si observa cualquier problema con los ojos mientras recibe [employer]. Su mdico le referir a Games developer de los ojos que Textron Inc ojos. Qu efectos secundarios puedo tener al [employer] este medicamento? Efectos secundarios que debe informar a su mdico o a Producer, television/film/video de la salud tan pronto como sea posible: -Therapist, art como erupcin cutnea, picazn o urticarias, hinchazn de la cara, labios o lengua -problemas respiratorios -cambios en la visin -dolor repentino o grave en el pecho, piernas, cabeza o ingle -cansancio o debilidad inusual  Efectos secundarios que, por lo general, no requieren atencin mdica (debe informarlos a su mdico o a su profesional de la salud si persisten o si son molestos): -dolor de espalda -dolor de cabeza -dolores musculares o articulares -problemas nasales o sinusales -dolor estomacal -cansancio Puede ser que esta lista no menciona todos los posibles efectos secundarios. Comunquese a su mdico por asesoramiento mdico [employer]lett[employer]ard. Usted puede informar los efectos secundarios a la FDA por telfono al 1-800-FDA-1088. Dnde debo guardar mi medicina? Mantngala fuera del alcance de los nios. Gurdela a San[employer]SCI, entre 15 y 30 grados C [phone]  y 71 grados F). Deseche todo el medicamento que no haya utilizado, despus de la fecha de vencimiento. ATENCIN: Este folleto es un resumen. Puede ser que no cubra toda la posible informacin. Si usted tiene preguntas acerca de esta medicina, consulte con su mdico, su farmacutico o su profesional de Radiographer, therapeutic.  2013, Elsevier/Gold Standard. (07/06/2008 3:31:51 PM) Denice Paradise transvaginal (Transvaginal Ultrasound) La ecografa transvaginal es una ecografa plvica en la que se utiliza una probeta metlica que se coloca en la vagina, para observar los rganos femeninos. El ecgrafo enva ondas sonoras desde un transductor (sonda). Estas ondas sonoras chocan contra las estructuras del cuerpo (como un eco) y crean Naval architect. La imagen se  observa en un monitor. Se denomina transvaginal debido a que la sonda se inserta dentro de la vagina. Puede haber una pequea molestia por la introduccin de la sonda. Esta prueba tambin puede realizarse [employer] Corporation. La ecografa endovaginal es otro nombre para la ecografa transvaginal. En una ecografa transabdominal, la sonda se coloca en la parte externa del abdomen. Este mtodo no ofrece imgenes tan buenas como la tcnica transvaginal. La ecogafa transvaginal se utiliza para observar alteraciones en el tracto genital femenino. Entre ellos se incluyen:  Problemas de infertilidad.  Malformaciones congnitas (defecto de nacimiento) del tero y los ovarios.  Tumores en el tero.  Hemorragias anormales.  Tumores y quistes de ovario.  Abscesos (tejidos inflamados y pus) en la pelvis.  Dolor abdominal o plvico sin causa aparente.  Infecciones plvicas. DURANTE EL EMBARAZO, SE UTILIZA PAR OBSERVAR:  Embarazos normales.  Un embarazo ectpico (embarazo fuera del tero).  Latidos cardacos fetales.  Anormalidades de la pelvis que no se observan bien con la ecografa transabdominal.  Sospecha de gemelos o embarazo mltiple.  Aborto inminente.  Problemas en el cuello del tero (cuello incompetente, no permanece cerrado para contener al beb).  Cuando se realiza una amniocentesis (se retira lquido de la bolsa [place], para ser Universal).  Al buscar anormalidades en el beb.  Para controlar el crecimiento, el desarrollo y la edad del feto.  Para medir la cantidad de lquido en el saco amnitico.  Cuando se realiza una versin externa del beb (se lo mueve a Loss adjuster, chartered).  Evaluar al beb en embarazos de alto riesgo (perfil biofsico).  Si se sospecha el deceso del beb (muerte). En algunos casos, se utiliza un mtodo especial denominado ecografa con infusin salina, para una observacin ms precisa del tero. Se inyecta solucin salina (agua con sal)  dentro del tero en pacientes no embarazadas para observar mejor su interior. Este mtodo no se Glass blower/designer. La probeta tambin puede usarse para obtener biopsias de Insurance claims handler, para drenar lquido de quistes de ovario y para Editor, commissioning un DIU (dispositivo intrauterino para el control de la natalidad) que no pueda [place]. PREPARACIN PARA LA PRUEBA La ecografa transvaginal se realiza con la vejiga vaca. La ecografa transabdominal se realiza con la vejiga llena. Podrn solicitarle que beba varios vasos de agua antes del examen. En algunos casos se realiza una ecografa transabdominal antes de la ecografa transvaginal para obervar los rganos del abdomen. PROCEDIMIENTO  Deber acostarse en una cama, con las rodillas dobladas y los pies en los estribos. La probeta se cubre con un condn. Dentro de la vagina y en la probeta se aplica un lubricante estril. El lubricante ayuda a transmitir las ondas sonoras y Nature conservation officer la irritacin de la vagina. El mdico mover la sonda en el interior de la cavidad vaginal  para escanear las estructuras plvicas. Un examen normal mostrar una pelvis normal y contenidos normales en su interior. Una prueba anormal mostrar anormalidades en la pelvis, la placenta o el beb. LAS CAUSAS DE UN RESULTADO ANORMAL PUEDEN SER:  Crecimientos o tumores en:  El tero.  Los ovarios.  La vagina.  Otras estructuras plvicas.  Crecimientos no cancerosos en el tero y los ovarios.  El ovario se retuerce y se corta el suministro de [place] (torsin [place]).  Las reas de infeccin incluyen:  Enfermedad inflamatoria plvica.  Un absceso en la pelvis.  Ubicacin de un DIU. LOS PROBLEMAS QUE PUEDEN HALLARSE EN UNA MUJER EMBARAZADA SON:  Embarazo ectpico (embarazo fuera del tero).  Embarazos mltiples.  Dilatacin (apertura) precoz anormal del cuello del tero. Esto puede indicar un cuello incompetente y Surveyor, mining.  Aborto  inminente.  Muerte fetal.  Los problemas con la placenta incluyen:  La placenta se ha desarrollado sobre la abertura del cuello del tero (placenta previa).  La placenta se ha separado anticipadamente en el tero (abrupcin placentaria).  La placenta se desarrolla en el msculo del tero (placenta acreta).  Tumores del Psychiatrist, incluyendo la enfermedad trofoblstica gestacional. Se trata de un embarazo anormal en el que no hay feto. El tero se llena de quistes similares a uvas que en algunos casos son cancerosos.  Posicin incorrecta del feto (de nalgas, de vrtice).  Retraso del desarrollo fetal intrauterino (escaso desarrollo en el tero).  Anormalidades o infeccin fetal. RIESGOS Y COMPLICACIONES No hay riesgos conocidos para la ecografa. No se toman radiografas cuando se realiza una ecografa. Document Released: 04/17/2008 Document Revised: 03/24/2011 [facility] Patient Information 2014 [place], [place].

## 2012-08-31 LAB — URINALYSIS W MICROSCOPIC + REFLEX CULTURE
Bacteria, UA: NONE SEEN
Bilirubin Urine: NEGATIVE
Crystals: NONE SEEN
Hgb urine dipstick: NEGATIVE
Ketones, ur: NEGATIVE mg/dL
Protein, ur: NEGATIVE mg/dL
Urobilinogen, UA: 0.2 mg/dL (ref 0.0–1.0)

## 2012-09-01 ENCOUNTER — Telehealth: Payer: Self-pay

## 2012-09-01 ENCOUNTER — Encounter: Payer: Self-pay | Admitting: Gynecology

## 2012-09-01 NOTE — Telephone Encounter (Signed)
Dr. Lily Peer asked me to check ins benefits for patient for Her Option Ablation and Mirena IUD so he could talk with her about these when she comes for u/s.    Mirena IUD is covered at 100%.  No precert required.  Her Option Ablation is applied to her $1500 ded (($122 met) 80/20 co-insurance. Her payment due prior to surgery will be $2,020.00  No precert required.

## 2012-09-10 ENCOUNTER — Ambulatory Visit: Payer: Self-pay | Admitting: Family Medicine

## 2012-09-20 ENCOUNTER — Other Ambulatory Visit: Payer: Managed Care, Other (non HMO)

## 2012-09-20 ENCOUNTER — Ambulatory Visit: Payer: Managed Care, Other (non HMO) | Admitting: Gynecology

## 2012-11-16 ENCOUNTER — Other Ambulatory Visit: Payer: Self-pay

## 2012-11-16 DIAGNOSIS — Z1231 Encounter for screening mammogram for malignant neoplasm of breast: Secondary | ICD-10-CM

## 2012-11-18 ENCOUNTER — Telehealth: Payer: Self-pay

## 2012-11-18 NOTE — Telephone Encounter (Signed)
No answer no vm  MM[facility]--scheduled--12/20/2012 Pap--Dr Fernandez--08/2012--normal Flu vaccine ?

## 2012-11-19 ENCOUNTER — Ambulatory Visit: Payer: Self-pay | Admitting: Internal Medicine

## 2012-11-19 NOTE — Telephone Encounter (Signed)
Rescheduled

## 2012-12-20 ENCOUNTER — Ambulatory Visit
Admission: RE | Admit: 2012-12-20 | Discharge: 2012-12-20 | Disposition: A | Discharge status: Home / Self Care | Payer: Managed Care, Other (non HMO) | Source: Ambulatory Visit

## 2012-12-20 DIAGNOSIS — Z1231 Encounter for screening mammogram for malignant neoplasm of breast: Secondary | ICD-10-CM

## 2012-12-24 ENCOUNTER — Telehealth: Payer: Self-pay

## 2012-12-24 NOTE — Telephone Encounter (Signed)
Left message for call back Non identifiable  Pap--05/2010--Dr Fernandez--neg [facility]--12/2012--neg Tdap--08/2012

## 2012-12-27 ENCOUNTER — Encounter: Payer: Self-pay | Admitting: Internal Medicine

## 2012-12-27 ENCOUNTER — Ambulatory Visit (INDEPENDENT_AMBULATORY_CARE_PROVIDER_SITE_OTHER): Payer: Managed Care, Other (non HMO) | Admitting: Internal Medicine

## 2012-12-27 VITALS — BP 115/78 | HR 70 | Temp 98.1°F | Wt 134.0 lb

## 2012-12-27 DIAGNOSIS — Z0000 Encounter for general adult medical examination without abnormal findings: Secondary | ICD-10-CM | POA: Insufficient documentation

## 2012-12-27 DIAGNOSIS — D649 Anemia, unspecified: Secondary | ICD-10-CM | POA: Insufficient documentation

## 2012-12-27 HISTORY — DX: Anemia, unspecified: D64.9

## 2012-12-27 LAB — FERRITIN: Ferritin: 18.4 ng/mL (ref 10.0–291.0)

## 2012-12-27 NOTE — Progress Notes (Signed)
Pre visit review using our clinic review tool, if applicable. No additional management support is needed unless otherwise documented below in the visit note. 

## 2012-12-27 NOTE — Assessment & Plan Note (Signed)
Patient has anemia, states that she had that for long time. Never had a colonoscopy. Reports very heavy periods, her gynecologist prescribed lysteda  but she never tried, reeason? GI ROS neg Plan: labs, MVI and iron OTC

## 2012-12-27 NOTE — Patient Instructions (Signed)
Get your blood work before you leave  Next visit for a  follow up, no fasting, in 6 months (anemia) Please make an appointment    TOME UNA PASTILLA MULTIVITAMINICA TODOS LOS DIAS TOME UNA PASTILLA DE HIERRO TODOS LOS DIAS HABLE CON EL DOCTOR FERNANDEZ ACERCA DE LA MEDICINA  LYSTEDA REGRESE EN 6 MESES

## 2012-12-27 NOTE — Telephone Encounter (Signed)
Unable to reach pre visit.  

## 2012-12-27 NOTE — Progress Notes (Signed)
   Subjective:    Patient ID: Ester Rink, female    DOB: [birth year], 47 y.o.   MRN: [id number]  HPI  New patient , request a yearly check up  No past medical history on file.  Past Surgical History  Procedure Laterality Date  . Cesarean section      X2  . Lithotripsy     Family History  Problem Relation Age of Onset  . Cancer Neg Hx   . CAD Neg Hx   . Diabetes Neg Hx    History   Social History  . Marital Status: Single    Spouse Name: N/A    Number of Children: 3  . Years of Education: N/A   Occupational History  . works at [employer] History Main Topics  . Smoking status: Never Smoker   . Smokeless tobacco: Never Used  . Alcohol Use: No     Comment: rare  . Drug Use: No  . Sexual Activity: No   Other Topics Concern  . Not on file   Social History Narrative   Original from [place]   Lives w/ son   Lives in [place] since ~ 2006    Review of Systems Diet-- not healthy Exercise-- walks almost daily during the summer, active at work No  CP, SOB, Denies  nausea, vomiting diarrhea Denies  blood in the stools (+) cough ,dry x 3 days, no fever, "have a URI"  No dysuria, gross hematuria, difficulty urinating   No anxiety, depression Occasional bilateral shoulder and knee pain for a few months,Denies pain or swelling in the small joints         Objective:   Physical Exam BP 115/78  Pulse 70  Temp(Src) 98.1 F (36.7 C)  Wt 134 lb (60.782 kg)  SpO2 99% General -- alert, well-developed, NAD.  Neck --no thyromegaly  Lungs -- normal respiratory effort, no intercostal retractions, no accessory muscle use, and normal breath sounds.  Heart-- normal rate, regular rhythm, no murmur.  Abdomen-- Not distended, good bowel sounds,soft, non-tender. Extremities-- no pretibial edema bilaterally ; Hands and wrists without   synovitis Neurologic--  alert & oriented X3. Speech normal, gait normal, strength normal in all extremities.  Psych--  Cognition and judgment appear intact. Cooperative with normal attention span and concentration. No anxious appearing , no depressed appearing.      Assessment & Plan:

## 2012-12-27 NOTE — Assessment & Plan Note (Addendum)
Td 2008 Declined flu shot Never had cscope Recent [facility] normal Female care per gyn Diet-exercise discussed  All recent labs  okay except for hemoglobin, see anemia

## 2012-12-29 ENCOUNTER — Encounter: Payer: Self-pay | Admitting: *Deleted

## 2013-02-17 ENCOUNTER — Ambulatory Visit (INDEPENDENT_AMBULATORY_CARE_PROVIDER_SITE_OTHER): Payer: Managed Care, Other (non HMO) | Admitting: Internal Medicine

## 2013-02-17 ENCOUNTER — Encounter: Payer: Self-pay | Admitting: Internal Medicine

## 2013-02-17 VITALS — BP 149/85 | HR 73 | Temp 98.0°F | Wt 137.0 lb

## 2013-02-17 DIAGNOSIS — R1011 Right upper quadrant pain: Secondary | ICD-10-CM

## 2013-02-17 DIAGNOSIS — R42 Dizziness and giddiness: Secondary | ICD-10-CM

## 2013-02-17 DIAGNOSIS — R030 Elevated blood-pressure reading, without diagnosis of hypertension: Secondary | ICD-10-CM

## 2013-02-17 DIAGNOSIS — IMO0001 Reserved for inherently not codable concepts without codable children: Secondary | ICD-10-CM

## 2013-02-17 NOTE — Progress Notes (Signed)
   Subjective:    Patient ID: Colleen Bailey, female    DOB: 1965-07-25, 48 y.o.   MRN: [id number]  HPI Acute visit, has 3 new issues  Yesterday she felt slightly dizzy, she was at work, BP was checked and it was elevated, does not recall the readings. Denies taking Motrin or similar medications.  On further questioning, she has dizziness on and off for several months, usually one episode a month. Symptoms described as a spinning, may last up to one hour. + associated headache. No nausea, diplopia. No diplopia, slurred speech, facial paresthesias or motor deficits.  Also, for the last 2 weeks has developed right upper quadrant discomfort, sometimes it lasts all day, she does not recall if it is related with food or moving her torso. Denies vomiting, diarrhea. No dysuria or gross hematuria.   No past medical history on file.  Past Surgical History  Procedure Laterality Date  . Cesarean section      X2  . Lithotripsy     History   Social History  . Marital Status: Single    Spouse Name: N/A    Number of Children: 3  . Years of Education: N/A   Occupational History  . works at [employer] Topics  . Smoking status: Never Smoker   . Smokeless tobacco: Never Used  . Alcohol Use: No     Comment: rare  . Drug Use: No  . Sexual Activity: No   Other Topics Concern  . Not on file   Social History Narrative   Original from [place] w/ son   Lives in [place] since ~ 2006     Review of Systems See HPI    Objective:   Physical Exam BP 149/85  Pulse 73  Temp(Src) 98 F (36.7 C)  Wt 137 lb (62.143 kg)  SpO2 99% General -- alert, well-developed, NAD.  Neck -- normal carotid pulse  HEENT-- Not pale.   Lungs -- normal respiratory effort, no intercostal retractions, no accessory muscle use, and normal breath sounds.  Heart-- normal rate, regular rhythm, no murmur.  Abdomen-- ND, NT, no mass, + BS Extremities-- no pretibial edema  bilaterally  Neurologic--  alert & oriented X3. Speech normal, gait normal, strength normal in all extremities.  DTRs symmetric   Psych-- Cognition and judgment appear intact. Cooperative with normal attention span and concentration. No anxious or depressed appearing.         Assessment & Plan:  Elevated blood pressure, recommend low salt diet, avoiding Motrin, monitor her BPs Followup 2 weeks  Dizziness --- unclear etiology, check brain MRI  Right abdominal pain- check ultrasound

## 2013-02-17 NOTE — Patient Instructions (Signed)
Check the  blood pressure 2 or 3 times a week be sure it is between 110/60 and 140/85. Ideal blood pressure is 120/80. If it is consistently higher or lower, let me know   Next visit is for routine check up 2 weeks       Dieta reducida en sodio, 2 gramos de sodio (2 Gram Low Sodium Diet) Esta dieta restringe la cantidad de sodio a no ms de 2 gramos o 2000 miligramos (mg) por Training and development officer. La cantidad de sodio se limita para ayudar a disminuir la presin arterial y es importante para la insuficiencia cardaca y los problemas renales y hepticos. Muchos alimentos contienen sodio para Engineer, maintenance (IT) y Programmer, systems como conservantes. Por lo tanto, cuando es necesario disminuir la cantidad de sodio en la dieta, es importante saber que productos hay que buscar cuando se eligen alimentos y bebidas. Puede demorar algn tiempo acostumbrarse a Building services engineer. Aqu se incluye informacin y [place] gua que lo ayudarn a adaptarse a una dieta reducida en sodio.  CONSEJOS PRCTICOS  No le agregue sal a los alimentos.  Evite los comidas de preparacin rpida que generalmente son elevadas en sodio.  Elija colaciones sin sal.  Compre productos con bajo contenido de sodio, en los que en su etiqueta diga: "bajo contenido de sodio" o "sin agregado de sal".  Verifique las etiquetas de los alimentos para Civil engineer, contracting cunto sodio hay en una porcin.  Cuando coma en un restaurante, pida que preparen su comida con menos sal, y sin nada de sal en lo posible. [place] DE LOS ALIMENTOS PARA INFORMARSE ACERCA DEL SODIO QUE CONTIENEN. La seccin Informacin Nutricional es un buen lugar en el que hallar cunta cantidad de sodio contienen los alimentos. Busque productos que no contengan ms de 500-600 mg/comida y no ms de 150 mg. por porcin Recuerde que 2 g = 2000 mg. La etiqueta de los alimentos tambin puede informar de ste modo:  Sin contenido de sodio: Menos de cinco mg. por porcin.  Muy  bajo contenido de sodio: 35 mg o menos por porcin.  Bajo contenido de sodio: 140 mg o menos por porcin.  Bajo en sodio: 50% menos de sodio por porcin. Por ejemplo, si un alimento generalmente contiene 300 mg de sodio se modifica para ser [employer], tendr 150 mg de sodio.  Reducido en sodio: 25% menos de sodio por porcin. Por ejemplo, si un alimento que generalmente tiene 400 mg de sodio se modifica para ser reducido en sodio, tendr 300 mg de sodio. ELECCIN DE LOS ALIMENTOS Granos  Evite: Galletitas y Wellsite geologist. Algunos cereales, incluyendo cereales calientes. Panificados y mezclas para bizcochos. Arroz condimentado o mezclas para pastas.  Elija: Colaciones sin sal. Cereales con bajo contenido de sodio, avena, arroz y trigo inflado, cereal para el desayuno. Pan y bollitos tipo Ingls. Pastas. Carnes  Evite: Saladas, en lata, ahumadas, condimentadas, en escabeche incluyendo pescado y pollo. [place], jamn, salchichas, fiambres, panchos, anchoas.  Elija: Atn y salmn enlatado con bajo contenido de sodio. Carne, [place] o congelados. Lcteos  Evite: Quesos procesados y cremosos. [employer]. [place] y [place]. Quesos comunes.  Elija: Leche. Queso cottage con bajo contenido de sodio. Yogur. Crema. Quesos con bajo contenido de [place]. Lambert Mody y Vegetales  Evite: Vegetales comunes en lata. Salsa de tomate y pastas en lata. Vegetales en salsas congelados. Aceitunas. Angie Fava. Salsas. Chucrut.  Elija: Vegetales en lata con bajo contenido de sodio. Salsa de tomate y  pastas con bajo contenido de sodio. Vegetales frescos o congelados. Frutas frescas y congeladas. Condimentos  Evite: Salsas en lata y envasadas. Salsa Worcestershire. Salsa trtara. Salsa Barbecue. Salsa de soja. Salsa de carne. Ketchup. Sal de cebolla, de ajo y sal de mesa. Saborizantes y tiernizantes para carne.  Elija: Hierbas y especias frescas y secas. Variedades de  [place] y ketchup con bajo contenido de sodio. Jugo de limn. Salsa tabasco. Rbanos picantes. EJEMPLO DE PLAN ALIMENTARIO CON 2 GRAMOS DE SODIO Desayuno / Sodio (mg)  1 taza de [employer] / 143 mg  2 rebanadas de pan integral tostado / 270 mg  1 cucharada de margarina en tubo saludable para el corazn / 153 mg  1 huevo duro / 139 mg  1 naranja pequea / 0 mg Almuerzo / Sodio (mg)  1 taza de zanahorias crudas / 76 mg   taza de garbanzos / 298 mg  1 taza de [employer] / 143 mg   taza de uvas negras / 2 mg  1 pan de pita de salvado / 356 mg Cena / Sodio (mg)  1 taza de pasta de salvado / 2 mg  1 taza de jugo de tomates bajo en sodio / 73 mg  3 onzas de carne picada magra / 57 mg  1 ensalada pequea (1 taza de espicanas crudas,  taza de pepinos,  taza de pimientos amarillos) con 1 cucharada de aceite de oliva y 1 cucharada de vinagre de vino / 25 mg Colacin / Sodio (mg)  1 pote de yogur de vainilla descremado / 107 mg  3 crackers de graham / 127 mg Anlisis nutricional  Caloras: 2033  Protenas: 77 g  Hidratos de carbono: 282 g  Grasa: 72 g  Sodio: 1.971 mg Document Released: 06/23/2005 Document Revised: 03/24/2011 ExitCare Patient Information 2014 [place], [place].

## 2013-02-17 NOTE — Progress Notes (Signed)
Pre visit review using our clinic review tool, if applicable. No additional management support is needed unless otherwise documented below in the visit note. 

## 2013-02-18 ENCOUNTER — Encounter: Payer: Self-pay | Admitting: Internal Medicine

## 2013-02-24 ENCOUNTER — Ambulatory Visit: Payer: Managed Care, Other (non HMO) | Admitting: Internal Medicine

## 2013-02-24 ENCOUNTER — Telehealth: Payer: Self-pay | Admitting: Internal Medicine

## 2013-02-24 DIAGNOSIS — K8020 Calculus of gallbladder without cholecystitis without obstruction: Secondary | ICD-10-CM

## 2013-02-24 NOTE — Telephone Encounter (Signed)
Advise patient, ultrasound performed @ [facility] on 02/22/2013 showed  Cholelithiasis (sent to be scanned) She is having intermittent right upper quadrant pain, arrange a referral to Gen. surgery and let the patient known --- dx  cholelithiasis

## 2013-02-25 NOTE — Telephone Encounter (Signed)
Pt still c/o of right upper quadrant pain and unable to eat without feeling nausea. Pt notified of gen surgery referral.

## 2013-03-01 ENCOUNTER — Ambulatory Visit (INDEPENDENT_AMBULATORY_CARE_PROVIDER_SITE_OTHER): Payer: Managed Care, Other (non HMO) | Admitting: Surgery

## 2013-03-03 ENCOUNTER — Ambulatory Visit: Payer: Managed Care, Other (non HMO) | Admitting: Internal Medicine

## 2013-03-07 ENCOUNTER — Ambulatory Visit (INDEPENDENT_AMBULATORY_CARE_PROVIDER_SITE_OTHER): Payer: Managed Care, Other (non HMO) | Admitting: Internal Medicine

## 2013-03-07 ENCOUNTER — Encounter: Payer: Self-pay | Admitting: Internal Medicine

## 2013-03-07 VITALS — BP 153/90 | HR 69 | Temp 97.9°F | Wt 137.0 lb

## 2013-03-07 DIAGNOSIS — F411 Generalized anxiety disorder: Secondary | ICD-10-CM

## 2013-03-07 DIAGNOSIS — D649 Anemia, unspecified: Secondary | ICD-10-CM

## 2013-03-07 DIAGNOSIS — IMO0001 Reserved for inherently not codable concepts without codable children: Secondary | ICD-10-CM

## 2013-03-07 DIAGNOSIS — R42 Dizziness and giddiness: Secondary | ICD-10-CM

## 2013-03-07 DIAGNOSIS — R030 Elevated blood-pressure reading, without diagnosis of hypertension: Secondary | ICD-10-CM

## 2013-03-07 HISTORY — DX: Reserved for inherently not codable concepts without codable children: IMO0001

## 2013-03-07 MED ORDER — ALPRAZOLAM 0.25 MG PO TABS
ORAL_TABLET | ORAL | Status: DC
Start: 2013-03-07 — End: 2013-03-18

## 2013-03-07 MED ORDER — CARVEDILOL 6.25 MG PO TABS: 6.2500 mg | ORAL_TABLET | Freq: Two times a day (BID) | ORAL | Status: DC

## 2013-03-07 NOTE — Assessment & Plan Note (Signed)
Anemia--Iron deficiency anemia in the setting of a heavy periods. The patient is 29, never had a colonoscopy, no FH of colon cancer. I advised her to see her gynecologist but so far she has not done that. Plan: Return in 2 months, we'll recheck labs, hopefully by then she will have her cholelithiasis taking care of. Consider GI referral for colonoscopy

## 2013-03-07 NOTE — Assessment & Plan Note (Signed)
Dizziness -- we  have been unable to approve the MRI or CT so far;  we'll try again

## 2013-03-07 NOTE — Progress Notes (Signed)
Pre visit review using our clinic review tool, if applicable. No additional management support is needed unless otherwise documented below in the visit note. 

## 2013-03-07 NOTE — Assessment & Plan Note (Signed)
elevated BP: Start a low dose of carvedilol, recheck in 2 months

## 2013-03-07 NOTE — Patient Instructions (Signed)
Check the  blood pressure 2 or 3 times a   week be sure it is between 110/60 and 140/85. Ideal blood pressure is 120/80. If it is consistently higher or lower, let me know  See Dr Toney Rakes  Next visit is for routine check up regards your   blood pressure   in 2 months  No need to come back fasting Please make an appointment    ----- Visite  al Dr Lacretia Leigh carvedilol Ardelia Mems pastilla 2 veces al dia  tomese la presion 2 o 3 veces por semana , debe estar entre 110/60 y 140/85   regrese en 2 meses, saque una cita

## 2013-03-07 NOTE — Progress Notes (Signed)
   Subjective:    Patient ID: Colleen Bailey, female    DOB: 12/07/1965, 48 y.o.   MRN: [id number]  DOS:  03/07/2013  Followup from previous visit , We discussed the following issues Dizziness--still there, we have been unable to get any brain imaging Right upper quadrant pain, ultrasound showed cholelithiasis, see surgeon soon. Elevated BP--today BP continued to be slightly elevated. See assessment and plan. Anxiety--will  take an airplane trip very soon, needs something for her nerves  ROS Still has on and off right upper quadrant discomfort. Denies fever, chills, nausea, vomiting, diarrhea. No blood in the stools. She continue to have monthly periods, they are usually very heavy the first day and they last one week.   Past Medical History  Diagnosis Date  . Elevated BP 03/07/2013  . Anemia 12/27/2012    Past Surgical History  Procedure Laterality Date  . Cesarean section      X2  . Lithotripsy    . Tubal ligation      History   Social History  . Marital Status: Single    Spouse Name: N/A    Number of Children: 3  . Years of Education: N/A   Occupational History  . works at [employer] Topics  . Smoking status: Never Smoker   . Smokeless tobacco: Never Used  . Alcohol Use: No     Comment: rare  . Drug Use: No  . Sexual Activity: No   Other Topics Concern  . Not on file   Social History Narrative   Original from [place] w/ son   Lives in [place] since ~ 2006        Medication List       This list is accurate as of: 03/07/13  5:41 PM.  Always use your most recent med list.               ALPRAZolam 0.25 MG tablet  Commonly known as:  XANAX  Tack 1 or 2 tabs before flying once a day as needed     carvedilol 6.25 MG tablet  Commonly known as:  COREG  Take 1 tablet (6.25 mg total) by mouth 2 (two) times daily with a meal.           Objective:   Physical Exam BP 153/90  Pulse 69  Temp(Src) 97.9 F (36.6  C)  Wt 137 lb (62.143 kg)  SpO2 100% General -- alert, well-developed, NAD.  N HEENT-- Not paleor jaundice Abdomen-- Not distended, good bowel sounds,soft, non-tender. No rebound or rigidity. No mass,organomegaly.  Extremities-- no pretibial edema bilaterally  Neurologic--  alert & oriented X3. Speech normal, gait normal, strength normal in all extremities.  Psych-- Cognition and judgment appear intact. Cooperative with normal attention span and concentration. No anxious or depressed appearing.        Assessment & Plan:    Cholelithiasis--to see surgery soon.    Anxiety.new issue: she gets very nervous when she flies, request something to help her with stress. Will try a low dose of Xanax, to take either half, one or 2 alprazolam as needed, rec to try before the fly to be learn how she reacts to the medication.

## 2013-03-14 ENCOUNTER — Ambulatory Visit (INDEPENDENT_AMBULATORY_CARE_PROVIDER_SITE_OTHER): Payer: Managed Care, Other (non HMO) | Admitting: Surgery

## 2013-03-18 ENCOUNTER — Ambulatory Visit (INDEPENDENT_AMBULATORY_CARE_PROVIDER_SITE_OTHER): Payer: Managed Care, Other (non HMO) | Admitting: Surgery

## 2013-03-18 ENCOUNTER — Telehealth (INDEPENDENT_AMBULATORY_CARE_PROVIDER_SITE_OTHER): Payer: Self-pay | Admitting: Surgery

## 2013-03-18 ENCOUNTER — Encounter (INDEPENDENT_AMBULATORY_CARE_PROVIDER_SITE_OTHER): Payer: Self-pay | Admitting: Surgery

## 2013-03-18 VITALS — BP 126/80 | HR 74 | Temp 98.7°F | Resp 16 | Ht 59.0 in | Wt 138.0 lb

## 2013-03-18 DIAGNOSIS — K8010 Calculus of gallbladder with chronic cholecystitis without obstruction: Secondary | ICD-10-CM | POA: Insufficient documentation

## 2013-03-18 NOTE — Telephone Encounter (Signed)
Patient met with surgery scheduling, went over financial responsibility, patient will call back to schedule

## 2013-03-18 NOTE — Progress Notes (Signed)
Patient ID: Colleen Bailey, female   DOB: 17-Jul-1965, 48 y.o.   MRN: [id number]  Chief Complaint  Patient presents with  . chplelithiasis    HPI Colleen Bailey is a 48 y.o. female.  Referred by Dr. Kathlene November for symptomatic cholelithiasis  HPI This is a 48 year old female in good health who presents with several months of intermittent right upper quadrant abdominal pain that radiates to her back. This is associated with some nausea and occasional vomiting but no diarrhea. She states that when she eats pork, she tends to have the symptoms. She underwent workup by Dr.Paz.  This apparently included an ultrasound but we do not have the report available. His note states that the ultrasound showed cholelithiasis. We will attempt to obtain this report. She is now referred for surgical evaluation for cholecystectomy.  Past Medical History  Diagnosis Date  . Elevated BP 03/07/2013  . Anemia 12/27/2012    Past Surgical History  Procedure Laterality Date  . Cesarean section      X2  . Lithotripsy    . Tubal ligation      Family History  Problem Relation Age of Onset  . Cancer Neg Hx   . CAD Neg Hx   . Diabetes Neg Hx     Social History History  Substance Use Topics  . Smoking status: Never Smoker   . Smokeless tobacco: Never Used  . Alcohol Use: No     Comment: rare    No Known Allergies  Current Outpatient Prescriptions  Medication Sig Dispense Refill  . carvedilol (COREG) 6.25 MG tablet Take 1 tablet (6.25 mg total) by mouth 2 (two) times daily with a meal.  60 tablet  3   No current facility-administered medications for this visit.    Review of Systems Review of Systems  Constitutional: Negative for fever, chills and unexpected weight change.  HENT: Negative for congestion, hearing loss, sore throat, trouble swallowing and voice change.   Eyes: Negative for visual disturbance.  Respiratory: Negative for cough and wheezing.   Cardiovascular: Negative for chest pain,  palpitations and leg swelling.  Gastrointestinal: Positive for nausea, vomiting, abdominal pain and abdominal distention. Negative for diarrhea, constipation, blood in stool and anal bleeding.  Genitourinary: Negative for hematuria, vaginal bleeding and difficulty urinating.  Musculoskeletal: Positive for back pain. Negative for arthralgias.  Skin: Negative for rash and wound.  Neurological: Negative for seizures, syncope and headaches.  Hematological: Negative for adenopathy. Does not bruise/bleed easily.  Psychiatric/Behavioral: Negative for confusion.    Blood pressure 126/80, pulse 74, temperature 98.7 F (37.1 C), temperature source Oral, resp. rate 16, height 4\' 11"  (1.499 m), weight 138 lb (62.596 kg).  Physical Exam Physical Exam WDWN in NAD HEENT:  EOMI, sclera anicteric Neck:  No masses, no thyromegaly Lungs:  CTA bilaterally; normal respiratory effort CV:  Regular rate and rhythm; no murmurs Abd:  +bowel sounds, soft, non-tender, no masses; healed infraumbilical incision Ext:  Well-perfused; no edema Skin:  Warm, dry; no sign of jaundice  Data Reviewed None  Assessment    Chronic calculus cholecystitis      Plan    Laparoscopic cholecystectomy with intraoperative cholangiogram. The surgical procedure has been discussed with the patient.  Potential risks, benefits, alternative treatments, and expected outcomes have been explained.  All of the patient's questions at this time have been answered.  The likelihood of reaching the patient's treatment goal is good.  The patient understand the proposed surgical procedure and wishes to proceed.  Colleen Sava K. 03/18/2013, 11:33 AM

## 2013-03-22 ENCOUNTER — Telehealth: Payer: Self-pay | Admitting: Internal Medicine

## 2013-03-22 NOTE — Telephone Encounter (Signed)
Spoke with patient, pain medication is not appropriate. Risks of complications from gallbladder stones including sepsis  discussed with the patient. Recommended ER if severe or persistent symptoms, fever, chills, jaundice. Will ask my scheduler to see about a referral to an academic Center

## 2013-03-22 NOTE — Telephone Encounter (Signed)
Patient called and stated that when she went to have her surgery done it ended up being too much money so she did not have it done. Patient is wondering if dr Larose Kells can prescribe her something for her stomach since she is unable to have her surgery.

## 2013-03-29 ENCOUNTER — Encounter (INDEPENDENT_AMBULATORY_CARE_PROVIDER_SITE_OTHER): Payer: Self-pay

## 2013-05-05 HISTORY — PX: CHOLECYSTECTOMY: SHX55

## 2013-05-26 ENCOUNTER — Ambulatory Visit (INDEPENDENT_AMBULATORY_CARE_PROVIDER_SITE_OTHER): Payer: Managed Care, Other (non HMO) | Admitting: Internal Medicine

## 2013-05-26 ENCOUNTER — Encounter: Payer: Self-pay | Admitting: Internal Medicine

## 2013-05-26 VITALS — BP 113/69 | HR 67 | Temp 98.8°F | Wt 141.4 lb

## 2013-05-26 DIAGNOSIS — K8010 Calculus of gallbladder with chronic cholecystitis without obstruction: Secondary | ICD-10-CM

## 2013-05-26 DIAGNOSIS — D649 Anemia, unspecified: Secondary | ICD-10-CM

## 2013-05-26 DIAGNOSIS — M25519 Pain in unspecified shoulder: Secondary | ICD-10-CM | POA: Insufficient documentation

## 2013-05-26 LAB — CBC WITH DIFFERENTIAL/PLATELET
BASOS ABS: 0 10*3/uL (ref 0.0–0.1)
Basophils Relative: 0.5 % (ref 0.0–3.0)
EOS ABS: 0.3 10*3/uL (ref 0.0–0.7)
Eosinophils Relative: 5.4 % — ABNORMAL HIGH (ref 0.0–5.0)
HEMATOCRIT: 33.7 % — AB (ref 36.0–46.0)
HEMOGLOBIN: 11.1 g/dL — AB (ref 12.0–15.0)
LYMPHS ABS: 2.2 10*3/uL (ref 0.7–4.0)
Lymphocytes Relative: 34.9 % (ref 12.0–46.0)
MCHC: 32.9 g/dL (ref 30.0–36.0)
MCV: 80.9 fl (ref 78.0–100.0)
MONOS PCT: 7.2 % (ref 3.0–12.0)
Monocytes Absolute: 0.5 10*3/uL (ref 0.1–1.0)
NEUTROS ABS: 3.3 10*3/uL (ref 1.4–7.7)
Neutrophils Relative %: 52 % (ref 43.0–77.0)
PLATELETS: 433 10*3/uL — AB (ref 150.0–400.0)
RBC: 4.17 Mil/uL (ref 3.87–5.11)
RDW: 12.9 % (ref 11.5–15.5)
WBC: 6.3 10*3/uL (ref 4.0–10.5)

## 2013-05-26 LAB — IRON: Iron: 79 ug/dL (ref 42–145)

## 2013-05-26 LAB — FERRITIN: FERRITIN: 19.4 ng/mL (ref 10.0–291.0)

## 2013-05-26 NOTE — Progress Notes (Signed)
Pre visit review using our clinic review tool, if applicable. No additional management support is needed unless otherwise documented below in the visit note. 

## 2013-05-26 NOTE — Progress Notes (Signed)
   Subjective:    Patient ID: Colleen Bailey, female    DOB: 07/29/1965, 48 y.o.   MRN: [id number]  DOS:  05/26/2013 Type of  visit: ROV Anemia, not taking iron supplements. Due for labs One-week history of right shoulder pain, anteriorly, worse at night, described as sharp on and off pain. Denies neck pain, injuries or overuse. Hypertension, good medication compliance, reports for BPs at home are normal. No actual readings. Status post cholecystectomy, surgery was uneventful per patient.   ROS Denies nausea, vomiting, diarrhea. No blood in the stools. Denies postprandial problems specifically no  shoulder pain postprandially. She has her periods monthly, they are occasionally heavy  Past Medical History  Diagnosis Date  . Elevated BP 03/07/2013  . Anemia 12/27/2012    Past Surgical History  Procedure Laterality Date  . Cesarean section      X2  . Lithotripsy    . Tubal ligation    . Cholecystectomy  05-05-2013    History   Social History  . Marital Status: Legally Separated    Spouse Name: N/A    Number of Children: 3  . Years of Education: N/A   Occupational History  . works at [employer] Topics  . Smoking status: Never Smoker   . Smokeless tobacco: Never Used  . Alcohol Use: No     Comment: rare  . Drug Use: No  . Sexual Activity: No   Other Topics Concern  . Not on file   Social History Narrative   Original from [place] w/ son   Lives in [place] since ~ 2006        Medication List       This list is accurate as of: 05/26/13  4:28 PM.  Always use your most recent med list.               carvedilol 6.25 MG tablet  Commonly known as:  COREG  Take 1 tablet (6.25 mg total) by mouth 2 (two) times daily with a meal.           Objective:   Physical Exam BP 113/69  Pulse 67  Temp(Src) 98.8 F (37.1 C) (Oral)  Wt 141 lb 6.4 oz (64.139 kg)  SpO2 100%  LMP 05/05/2013 General -- alert, well-developed,  NAD.  Neck --FROM, no TTP Lungs -- normal respiratory effort, no intercostal retractions, no accessory muscle use, and normal breath sounds.  Heart-- normal rate, regular rhythm, no murmur.  Extremities--  Left shoulder normal Right shoulder normal to inspection, range of motion is full, slightly tender at the proximal biceps Neurologic--  alert & oriented X3. Speech normal, gait normal, strength normal in all extremities.  Psych-- Cognition and judgment appear intact. Cooperative with normal attention span and concentration. No anxious or depressed appearing.        Assessment & Plan:

## 2013-05-26 NOTE — Assessment & Plan Note (Signed)
Status post surgery from 05-05-13

## 2013-05-26 NOTE — Assessment & Plan Note (Signed)
  Right shoulder pain, New problem. One-week history of right shoulder pain, I recommended conservative treatment with OTCs, Suspect bicipital tendinitis. She's not interested in " more pills" , likes a local injection. Doubt  shoulder pain related to recent abdominal surgery. Plan: Observation for 2  weeks, if not better will refer to sports medicine.

## 2013-05-26 NOTE — Patient Instructions (Signed)
Get your blood work before you leave   Return the stool card  See your gynecologist  Next visit is for a physical exam by 12-2013  , fasting Please make an appointment

## 2013-05-26 NOTE — Assessment & Plan Note (Signed)
See previous entries, not taking iron supplements, due for labs. Again she reports is a lifelong problem, probably related to heavy periods, recommend to see gynecology. Will check a CBC, iron and ferritin.

## 2013-05-27 ENCOUNTER — Encounter: Payer: Self-pay | Admitting: *Deleted

## 2013-07-28 ENCOUNTER — Telehealth: Payer: Self-pay | Admitting: Internal Medicine

## 2013-07-28 DIAGNOSIS — M25519 Pain in unspecified shoulder: Secondary | ICD-10-CM

## 2013-07-28 NOTE — Telephone Encounter (Signed)
Arrange a referral to sports medicine, Dr. Tamala Julian

## 2013-07-28 NOTE — Telephone Encounter (Signed)
Caller name: Leianna  Call back [place]:   Reason for call:  Pt wants a referral to somebody.  Her should is still hurting.

## 2013-07-28 NOTE — Telephone Encounter (Signed)
Sorry, her shoulder is still hurting.  She states she wants to be referred to someone, maybe an orthopedic doctor to look at the shoulder.

## 2013-07-28 NOTE — Telephone Encounter (Signed)
Can you repeat that please . Thanks.

## 2013-07-28 NOTE — Telephone Encounter (Signed)
Done

## 2013-08-02 ENCOUNTER — Ambulatory Visit: Payer: Managed Care, Other (non HMO) | Admitting: Family Medicine

## 2013-08-08 ENCOUNTER — Ambulatory Visit: Payer: Managed Care, Other (non HMO) | Admitting: Family Medicine

## 2013-08-22 ENCOUNTER — Ambulatory Visit: Payer: Managed Care, Other (non HMO) | Admitting: Family Medicine

## 2013-08-23 ENCOUNTER — Encounter: Payer: Self-pay | Admitting: Family Medicine

## 2013-08-23 ENCOUNTER — Ambulatory Visit (INDEPENDENT_AMBULATORY_CARE_PROVIDER_SITE_OTHER): Payer: Managed Care, Other (non HMO) | Admitting: Family Medicine

## 2013-08-23 VITALS — BP 135/84 | HR 81 | Ht 60.0 in | Wt 142.0 lb

## 2013-08-23 DIAGNOSIS — M25512 Pain in left shoulder: Principal | ICD-10-CM

## 2013-08-23 DIAGNOSIS — M25519 Pain in unspecified shoulder: Secondary | ICD-10-CM

## 2013-08-23 DIAGNOSIS — M25511 Pain in right shoulder: Secondary | ICD-10-CM

## 2013-08-23 MED ORDER — METHYLPREDNISOLONE ACETATE 40 MG/ML IJ SUSP
40.0000 mg | Freq: Once | INTRAMUSCULAR | Status: AC
  Administered 2013-08-23: 40 mg via INTRA_ARTICULAR

## 2013-08-23 NOTE — Patient Instructions (Signed)
You have rotator cuff impingement Try to avoid painful activities (overhead activities, lifting with extended arm) as much as possible. Aleve 2 tabs twice a day with food OR ibuprofen 3 tabs three times a day with food for pain and inflammation if needed. Can take tylenol in addition to this. Subacromial injection may be beneficial to help with pain and to decrease inflammation - you were given these today. Consider physical therapy with transition to home exercise program. Do home exercise program with theraband and scapular stabilization exercises daily - these are very important for long term relief even if an injection was given. 3 sets of 10 once a day - start these in about 5 days. If not improving at follow-up we will consider further imaging, physical therapy and/or nitro patches. Follow up with me in 1 month to 6 weeks.

## 2013-08-25 ENCOUNTER — Encounter: Payer: Self-pay | Admitting: Family Medicine

## 2013-08-25 DIAGNOSIS — M25511 Pain in right shoulder: Secondary | ICD-10-CM | POA: Insufficient documentation

## 2013-08-25 DIAGNOSIS — M25512 Pain in left shoulder: Principal | ICD-10-CM

## 2013-08-25 NOTE — Progress Notes (Signed)
Patient ID: Colleen Bailey, female   DOB: Jan 24, 1965, 48 y.o.   MRN: [id number]  PCP and Referred by: Kathlene November, MD  Subjective:   HPI: Patient is a 48 y.o. female here for bilateral shoulder pain.  Patient reports for a few months she has had bilateral shoulder pain. Difficulty sleeping, lying on each shoulder. No injury or trauma. Better during the day when she is working, worse at night and first thing in the morning. Taking motrin as needed for pain. No prior issues.  Past Medical History  Diagnosis Date  . Elevated BP 03/07/2013  . Anemia 12/27/2012    Current Outpatient Prescriptions on File Prior to Visit  Medication Sig Dispense Refill  . carvedilol (COREG) 6.25 MG tablet Take 1 tablet (6.25 mg total) by mouth 2 (two) times daily with a meal.  60 tablet  3   No current facility-administered medications on file prior to visit.    Past Surgical History  Procedure Laterality Date  . Cesarean section      X2  . Lithotripsy    . Tubal ligation    . Cholecystectomy  05-05-2013    No Known Allergies  History   Social History  . Marital Status: Legally Separated    Spouse Name: N/A    Number of Children: 3  . Years of Education: N/A   Occupational History  . works at [employer] Topics  . Smoking status: Never Smoker   . Smokeless tobacco: Never Used  . Alcohol Use: No     Comment: rare  . Drug Use: No  . Sexual Activity: No   Other Topics Concern  . Not on file   Social History Narrative   Original from [place] w/ son   Lives in [place] since ~ 2006    Family History  Problem Relation Age of Onset  . Cancer Neg Hx   . CAD Neg Hx   . Diabetes Neg Hx     BP 135/84  Pulse 81  Ht 5' (1.524 m)  Wt 142 lb (64.411 kg)  BMI 27.73 kg/m2  Review of Systems: See HPI above.    Objective:  Physical Exam:  Gen: NAD  Bilateral shoulders: No swelling, ecchymoses.  No gross deformity. No TTP. FROM with painful  arcs. Positive Hawkins, negative Neers. Negative Speeds, Yergasons. Strength 5/5 with empty can and resisted internal/external rotation.  Pain empty can. Negative apprehension. NV intact distally.    Assessment & Plan:  1. Bilateral shoulder pain - 2/2 rotator cuff impingement.  Discussed options - she would like to try injections and home exercise program which was demonstrated today.  NSAIDs as needed.  Consider physical therapy, nitro patches, imaging if not improving as expected.  F/u in 1 month to 6 weeks.  After informed written consent, patient was seated on exam table. Right shoulder was prepped with alcohol swab and utilizing posterior approach, patient's right glenohumeral space was injected with 3:1 marcaine: depomedrol. Patient tolerated the procedure well without immediate complications.  After informed written consent, patient was seated on exam table. Left shoulder was prepped with alcohol swab and utilizing posterior approach, patient's left glenohumeral space was injected with 3:1 marcaine: depomedrol. Patient tolerated the procedure well without immediate complications.

## 2013-08-25 NOTE — Assessment & Plan Note (Signed)
2/2 rotator cuff impingement.  Discussed options - she would like to try injections and home exercise program which was demonstrated today.  NSAIDs as needed.  Consider physical therapy, nitro patches, imaging if not improving as expected.  F/u in 1 month to 6 weeks.  After informed written consent, patient was seated on exam table. Right shoulder was prepped with alcohol swab and utilizing posterior approach, patient's right glenohumeral space was injected with 3:1 marcaine: depomedrol. Patient tolerated the procedure well without immediate complications.  After informed written consent, patient was seated on exam table. Left shoulder was prepped with alcohol swab and utilizing posterior approach, patient's left glenohumeral space was injected with 3:1 marcaine: depomedrol. Patient tolerated the procedure well without immediate complications.

## 2013-09-26 ENCOUNTER — Ambulatory Visit: Payer: Managed Care, Other (non HMO) | Admitting: Family Medicine

## 2013-10-24 ENCOUNTER — Encounter: Payer: Self-pay | Admitting: Gynecology

## 2013-10-24 ENCOUNTER — Other Ambulatory Visit (HOSPITAL_COMMUNITY)
Admission: RE | Admit: 2013-10-24 | Discharge: 2013-10-24 | Disposition: A | Discharge status: Home / Self Care | Payer: Managed Care, Other (non HMO) | Source: Ambulatory Visit | Attending: Gynecology | Admitting: Gynecology

## 2013-10-24 ENCOUNTER — Ambulatory Visit (INDEPENDENT_AMBULATORY_CARE_PROVIDER_SITE_OTHER): Payer: Managed Care, Other (non HMO) | Admitting: Gynecology

## 2013-10-24 VITALS — BP 106/66 | Ht 58.25 in | Wt 141.4 lb

## 2013-10-24 DIAGNOSIS — Z01419 Encounter for gynecological examination (general) (routine) without abnormal findings: Secondary | ICD-10-CM

## 2013-10-24 DIAGNOSIS — Z1151 Encounter for screening for human papillomavirus (HPV): Secondary | ICD-10-CM | POA: Insufficient documentation

## 2013-10-24 DIAGNOSIS — D500 Iron deficiency anemia secondary to blood loss (chronic): Secondary | ICD-10-CM

## 2013-10-24 DIAGNOSIS — R1900 Intra-abdominal and pelvic swelling, mass and lump, unspecified site: Secondary | ICD-10-CM

## 2013-10-24 DIAGNOSIS — R635 Abnormal weight gain: Secondary | ICD-10-CM

## 2013-10-24 DIAGNOSIS — Z124 Encounter for screening for malignant neoplasm of cervix: Secondary | ICD-10-CM

## 2013-10-24 NOTE — Progress Notes (Signed)
Colleen Bailey 1965/11/19 [id number]   History:    48 y.o.  for annual gyn exam who has been followed by her PCP Dr. Larose Kells. Review of his nose indicated that he had given her fecal Hemoccult cards attachment to the office for testing. Patient has had a history of chronic anemia currently not taking any iron supplementation. Patient not sexually active. Patient reports normal menstrual cycles now. 2 of her children were delivered vaginally one previous C-section and a tubal sterilization. Patient with no previous history of any abnormal Pap smears. Patient does have history of dense breast and her last mammogram a 3-dimensional mammogram was performed and was normal in December 2014.  Past medical history,surgical history, family history and social history were all reviewed and documented in the EPIC chart.  Gynecologic History Patient's last menstrual period was 09/27/2013. Contraception: tubal ligation Last Pap: 2012. Results were: normal Last mammogram:  2014. Results were: Normal 3-D mammogram done because of breast being dense  Obstetric History OB History  Gravida Para Term Preterm AB SAB TAB Ectopic Multiple Living  5 3   2  2   3     # Outcome Date GA Lbr Len/2nd Weight Sex Delivery Anes PTL Lv  5 TAB           4 TAB           3 PAR           2 PAR           1 PAR                ROS: A ROS was performed and pertinent positives and negatives are included in the history.  GENERAL: No fevers or chills. HEENT: No change in vision, no earache, sore throat or sinus congestion. NECK: No pain or stiffness. CARDIOVASCULAR: No chest pain or pressure. No palpitations. PULMONARY: No shortness of breath, cough or wheeze. GASTROINTESTINAL: No abdominal pain, nausea, vomiting or diarrhea, melena or bright red blood per rectum. GENITOURINARY: No urinary frequency, urgency, hesitancy or dysuria. MUSCULOSKELETAL: No joint or muscle pain, no back pain, no recent trauma. DERMATOLOGIC: No rash, no  itching, no lesions. ENDOCRINE: No polyuria, polydipsia, no heat or cold intolerance. No recent change in weight. HEMATOLOGICAL: No anemia or easy bruising or bleeding. NEUROLOGIC: No headache, seizures, numbness, tingling or weakness. PSYCHIATRIC: No depression, no loss of interest in normal activity or change in sleep pattern.     Exam: chaperone present  BP 106/66  Ht 4' 10.25" (1.48 m)  Wt 141 lb 6.4 oz (64.139 kg)  BMI 29.28 kg/m2  LMP 09/27/2013  Body mass index is 29.28 kg/(m^2).  General appearance : Well developed well nourished female. No acute distress HEENT: Neck supple, trachea midline, no carotid bruits, no thyroidmegaly Lungs: Clear to auscultation, no rhonchi or wheezes, or rib retractions  Heart: Regular rate and rhythm, no murmurs or gallops Breast:Examined in sitting and supine position were symmetrical in appearance, no palpable masses or tenderness,  no skin retraction, no nipple inversion, no nipple discharge, no skin discoloration, no axillary or supraclavicular lymphadenopathy Abdomen: no palpable masses or tenderness, no rebound or guarding Extremities: no edema or skin discoloration or tenderness  Pelvic:  [place], Urethra, Skene Glands: Within normal limits             Vagina: No gross lesions or discharge  Cervix: No gross lesions or discharge  Uterus  anteverted, normal size, shape and consistency, non-tender and mobile  Adnexa  pelvic fullness question of a fibroid  Anus and perineum  normal   Rectovaginal  normal sphincter tone without palpated masses or tenderness             Hemoccult not done     Assessment/Plan:  48 y.o. female for annual exam will return to the office in 1-2 weeks for a pelvic ultrasound for better assessment of her adnexa. Patient's Tdap is up to date. She has not received the flu vaccine currently with a mild upper respiratory tract infection. Pap smear was done today. When patient returns back to the office for ultrasound  where point ask her if she submitted to her PCP the fecal Hemoccult cards for testing. We discussed importance of monthly breast exam. In December she will need a followup mammogram three-dimensional because of her breast being dense. We'll give her the flu vaccine which she returns if her mild upper respiratory tract infection has resolved. We discussed importance of calcium vitamin D and regular exercise for osteoporosis prevention.   She returns if she submitted to Dr. Larose Kells her fecal Hemoccult cards.   Terrance Mass MD, 11:34 AM 10/24/2013

## 2013-10-24 NOTE — Patient Instructions (Signed)
Hipertensin (Hypertension) La hipertensin, conocida comnmente como presin arterial alta, se produce cuando la sangre bombea en las arterias con mucha fuerza. Las arterias son los vasos sanguneos que transportan la sangre desde el corazn hacia todas las partes del cuerpo. Una lectura de la presin arterial consiste en un nmero ms alto sobre un nmero ms bajo, por ejemplo, 110/72. El nmero ms alto (presin sistlica) corresponde a la presin interna de las arterias cuando el corazn [place]. El nmero ms bajo (presin diastlica) corresponde a la presin interna de las arterias cuando el corazn se relaja. En condiciones ideales, la presin arterial debe ser inferior a 120/80. La hipertensin fuerza al corazn a trabajar ms para Herbalist. Las arterias pueden estrecharse o ponerse rgidas. La hipertensin conlleva el riesgo de enfermedad cardaca, ictus y otros problemas.  [place] de riesgo de hipertensin son controlables, pero otros no lo son.  [employer] factores de riesgo que usted no puede Chief Technology Officer, se incluyen:   Manufacturing systems engineer. El riesgo es mayor para las Retail banker.  La edad. Los riesgos aumentan con la edad.  El sexo. Antes de los 45aos, los hombres corren ms [employer]. Despus de los 65aos, las mujeres corren ms [employer]. Entre los factores de riesgo que usted puede Chief Technology Officer, se incluyen:  No hacer la cantidad suficiente de actividad fsica o ejercicio.  Tener sobrepeso.  Consumir mucha grasa, azcar, caloras o sal en la dieta.  Beber alcohol en exceso. SIGNOS Y SNTOMAS Por lo general, la hipertensin no causa signos o sntomas. La hipertensin demasiado alta (crisis hipertensiva) puede causar dolor de cabeza, ansiedad, falta de aire y hemorragia nasal. DIAGNSTICO  Para detectar si usted tiene hipertensin, el mdico le medir la presin arterial mientras est sentado, con el brazo  levantado a la altura del corazn. Debe medirla al [facility] veces en el mismo brazo. Determinadas condiciones pueden causar una diferencia de presin arterial entre el brazo izquierdo y Insurance underwriter. El hecho de tener una sola lectura de la presin arterial ms alta que lo normal no significa que Stage manager. En el caso de tener una lectura de la presin arterial con un valor alto, pdale al mdico que la verifique nuevamente. [place] hipertensin arterial incluye hacer cambios en el estilo de vida y, posiblemente, tomar medicamentos. Un estilo de vida saludable puede ayudar a bajar la presin arterial alta. Quiz deba cambiar algunos hbitos. Los [employer] en el estilo de vida pueden incluir:  Seguir la dieta DASH. Esta dieta tiene un alto contenido de frutas, verduras y Psychologist, prison and probation services. Incluye poca cantidad de sal, carnes rojas y azcares agregados.  Hacer al menos 2horas de actividad fsica enrgica todas las semanas.  Perder peso, si es necesario.  No fumar.  Limitar el consumo de bebidas alcohlicas.  Aprender formas de reducir el estrs. Si los cambios en el estilo de vida no son suficientes para Child psychotherapist la presin arterial, el mdico puede recetarle medicamentos. Quiz necesite tomar ms de uno. Trabaje en conjunto con su mdico para comprender los riesgos y los beneficios. INSTRUCCIONES PARA EL CUIDADO EN EL HOGAR  Haga que le midan de nuevo la presin arterial segn las indicaciones del [place] los medicamentos solamente como se lo haya indicado el mdico. Siga cuidadosamente las indicaciones. Los medicamentos para la presin arterial deben tomarse segn las indicaciones. Los medicamentos pierden eficacia al omitir las dosis. El hecho de omitir  las dosis tambin aumenta el riesgo de otros [place].  No fume.  Contrlese la presin arterial en su casa segn las indicaciones del mdico. SOLICITE ATENCIN MDICA SI:   Piensa  que tiene una reaccin alrgica a los medicamentos.  Tiene mareos o dolores de cabeza con Scientist, research (physical sciences).  Tiene hinchazn en los tobillos.  Tiene problemas de visin. SOLICITE ATENCIN MDICA DE INMEDIATO SI:  Siente un dolor de cabeza intenso o confusin.  Siente debilidad inusual, adormecimiento o que Geneticist, molecular.  Siente dolor intenso en el pecho o en el abdomen.  Vomita repetidas veces.  Tiene dificultad para respirar. ASEGRESE DE QUE:   Comprende estas instrucciones.  Controlar su afeccin.  Recibir ayuda de inmediato si no mejora o si empeora. Document Released: 12/30/2004 Document Revised: 05/16/2013 [facility] Patient Information 2015 [place], [place]. This information is not intended to replace advice given to you by your health care provider. Make sure you discuss any questions you have with your health care provider. Anemia inespecfica (Anemia, Nonspecific) La anemia es una enfermedad en la que la concentracin de glbulos rojos o el nivel de hemoglobina en la sangre estn por debajo de lo normal. La hemoglobina es la sustancia de los glbulos rojos que lleva el oxgeno a todo el cuerpo. La anemia da como resultado que los tejidos no reciban la cantidad suficiente de oxgeno.  CAUSAS  Las causas ms frecuentes de anemia son:   Elvina Mattes. El sangrado puede ser interno o externo. Incluye sangrado excesivo debido al perodo (en las mujeres) o por los intestinos.   Dficit nutricional.   Enfermedad renal, tiroidea o heptica crnicas.  Enfermedades de la mdula sea que disminuyen la produccin de glbulos rojos.  Cncer y tratamientos para Science writer.  VIH, sida y sus tratamientos.  Trastornos del bazo que aumentan la destruccin de glbulos rojos.  Enfermedades de [employer].  Destruccin excesiva de glbulos rojos debido a una infeccin, a medicamentos y a Nurse, mental health. SIGNOS Y SNTOMAS   Debilidad leve.   Mareos.   Dolor de  [place].  Palpitaciones.   Falta de aire, especialmente con el ejercicio.   Palidez.  Sensibilidad al fro.  Indigestin.  Nuseas.  Dificultad para dormir.  Dificultad para concentrarse. Los sntomas pueden ocurrir repentinamente o pueden Psychologist, forensic.  DIAGNSTICO  Con frecuencia es necesario realizar anlisis de [employer]. Estos ayudan al profesional a Adult nurse. Su mdico controlar la materia fecal para Hydrographic surveyor la presencia de [place] y buscar otras causas de prdida de [place].  TRATAMIENTO  El tratamiento vara segn la causa de la anemia. Las opciones de tratamiento son:   Suplementos de hierro, vitamina D62, o cido flico.   Medicamentos con hormonas.   Transfusin de [place]. Ser necesaria en los casos de prdida de [place] grave.   Hospitalizacin. Ser necesaria si la prdida de sangre es continua y significativa.   Cambios en la dieta.  Extirpacin del bazo. INSTRUCCIONES PARA EL CUIDADO EN EL HOGAR Cumpla con todas las visitas de control. Generalmente demora varias semanas corregir la anemia, y es muy importante que el mdico controle su enfermedad y su respuesta al [place]. SOLICITE ATENCIN MDICA DE INMEDIATO SI:   Siente debilidad extrema, falta de aire o dolor en el pecho.   Se siente mareado o tiene dificultad para concentrarse.  Tiene una hemorragia vaginal abundante.   Aparece una erupcin cutnea.   La materia fecal es negra, de aspecto alquitranado.   Se desmaya.   Vomita sangre.  Vomita repetidas veces.   Siente dolor abdominal.  Tiene fiebre o sntomas persistentes durante ms de 2 - 3 das.   Tiene fiebre y los sntomas empeoran repentinamente.   Se deshidrata.  ASEGRESE DE QUE:  Comprende estas instrucciones.  Controlar su afeccin.  Recibir ayuda de inmediato si no mejora o si empeora. Document Released: 12/30/2004 Document Revised: 09/01/2012 [facility]  Patient Information 2015 [place]. This information is not intended to replace advice given to you by your health care provider. Make sure you discuss any questions you have with your health care provider.

## 2013-10-26 LAB — CYTOLOGY - PAP

## 2013-11-01 ENCOUNTER — Other Ambulatory Visit: Payer: Self-pay

## 2013-11-01 DIAGNOSIS — Z1239 Encounter for other screening for malignant neoplasm of breast: Secondary | ICD-10-CM

## 2013-11-11 ENCOUNTER — Ambulatory Visit (INDEPENDENT_AMBULATORY_CARE_PROVIDER_SITE_OTHER): Payer: Managed Care, Other (non HMO)

## 2013-11-11 ENCOUNTER — Other Ambulatory Visit: Payer: Self-pay | Admitting: Gynecology

## 2013-11-11 ENCOUNTER — Ambulatory Visit (INDEPENDENT_AMBULATORY_CARE_PROVIDER_SITE_OTHER): Payer: Managed Care, Other (non HMO) | Admitting: Gynecology

## 2013-11-11 DIAGNOSIS — R102 Pelvic and perineal pain: Secondary | ICD-10-CM

## 2013-11-11 DIAGNOSIS — D251 Intramural leiomyoma of uterus: Secondary | ICD-10-CM

## 2013-11-11 DIAGNOSIS — N920 Excessive and frequent menstruation with regular cycle: Secondary | ICD-10-CM

## 2013-11-11 DIAGNOSIS — D500 Iron deficiency anemia secondary to blood loss (chronic): Secondary | ICD-10-CM

## 2013-11-11 DIAGNOSIS — R1900 Intra-abdominal and pelvic swelling, mass and lump, unspecified site: Secondary | ICD-10-CM

## 2013-11-11 DIAGNOSIS — R1030 Lower abdominal pain, unspecified: Secondary | ICD-10-CM

## 2013-11-11 DIAGNOSIS — R109 Unspecified abdominal pain: Secondary | ICD-10-CM

## 2013-11-11 NOTE — Progress Notes (Signed)
   Patient was seen in the office on October 12 for annual gynecological examination. Review of her record and indicated that her primary physician Dr. Larose Kells had diagnosed her with chronic anemia and patient never submitted her fecal Hemoccult cards for testing in the office. She was complaining of low abdominal discomfort so an ultrasound had been ordered and this is the reason for today's visit. She has informed me that over the course of the past year she has been having cycles that last up to 2 weeks this last one lasted only a week. Patient states she's never had any further workup. This appears to be her source of her anemia. She was counseled today also for an endometrial biopsy. She has had a previous tubal ligation.  Exam: See note from 10/24/2013  Ultrasound: Uterus measured 9.8 x 5.3 x 3.9 cm with endometrial stripe of 11 mm. Prominent endometrial avascular was noted. An intramural fibroid of 17 x 19 mm was noted. Right ovary was normal. Left ovary was normal. No fluid in the cul-de-sac. No apparent adnexal masses.  Patient was counseled for endometrial biopsy. The cervix was cleansed with Betadine solution. A sterile Pipelle was introduced into the uterine cavity. The uterus measured 7 cm. Moderate amount of tissue was obtained and was omitted for histological evaluation  Assessment/plan: #1 nonspecific lower abdominal pain. Patient with chronic anemia appears to be related to her menorrhagia. She still has not submitted to her PCP her fecal Hemoccult cards. We will give one to submit to the office on her next visit. She was reminded to start her iron supplementation since her last hemoglobin was 11.1. Will notify with the results of the endometrial biopsy. If biopsy is benign recommended she consider placing a Mirena IUD to minimize her bleeding and correct her anemia. All this information was provided in [place].

## 2013-11-14 ENCOUNTER — Encounter: Payer: Self-pay | Admitting: Gynecology

## 2013-12-02 ENCOUNTER — Telehealth: Payer: Self-pay | Admitting: Gynecology

## 2013-12-02 NOTE — Telephone Encounter (Signed)
12/02/13-Colleen Bailey to advise pt that her [employer] Ins will cover the Mirena & insertion with Med DX of N92.0(menorrhagia) at 100%, no copay or deductible.wl

## 2013-12-26 ENCOUNTER — Ambulatory Visit: Payer: Managed Care, Other (non HMO)

## 2014-01-09 ENCOUNTER — Ambulatory Visit: Payer: Managed Care, Other (non HMO) | Admitting: Family Medicine

## 2014-01-09 ENCOUNTER — Telehealth: Payer: Self-pay | Admitting: *Deleted

## 2014-01-09 NOTE — Telephone Encounter (Signed)
charge 

## 2014-01-09 NOTE — Telephone Encounter (Signed)
Pt did not show for appointment 01/09/2014 at 4:00pm for "felt dizzy yesterday"

## 2014-01-10 NOTE — Telephone Encounter (Signed)
See note below

## 2014-01-12 ENCOUNTER — Inpatient Hospital Stay: Admission: RE | Admit: 2014-01-12 | Payer: Managed Care, Other (non HMO) | Source: Ambulatory Visit

## 2014-02-03 ENCOUNTER — Ambulatory Visit: Payer: Managed Care, Other (non HMO) | Admitting: Internal Medicine

## 2014-02-09 ENCOUNTER — Ambulatory Visit: Payer: Managed Care, Other (non HMO) | Admitting: Internal Medicine

## 2014-02-13 ENCOUNTER — Ambulatory Visit: Payer: Managed Care, Other (non HMO) | Attending: Internal Medicine | Admitting: Internal Medicine

## 2014-02-13 ENCOUNTER — Encounter: Payer: Self-pay | Admitting: Internal Medicine

## 2014-02-13 VITALS — BP 138/87 | HR 71 | Temp 98.6°F | Ht 59.0 in | Wt 148.6 lb

## 2014-02-13 DIAGNOSIS — R42 Dizziness and giddiness: Secondary | ICD-10-CM

## 2014-02-13 DIAGNOSIS — Z833 Family history of diabetes mellitus: Secondary | ICD-10-CM

## 2014-02-13 DIAGNOSIS — K088 Other specified disorders of teeth and supporting structures: Secondary | ICD-10-CM

## 2014-02-13 DIAGNOSIS — K0889 Other specified disorders of teeth and supporting structures: Secondary | ICD-10-CM

## 2014-02-13 LAB — POCT GLYCOSYLATED HEMOGLOBIN (HGB A1C): Hemoglobin A1C: 5.8

## 2014-02-13 MED ORDER — IBUPROFEN 600 MG PO TABS: 600.0000 mg | ORAL_TABLET | Freq: Three times a day (TID) | ORAL | Status: DC | PRN

## 2014-02-13 MED ORDER — AMOXICILLIN 500 MG PO CAPS: 500.0000 mg | ORAL_CAPSULE | Freq: Three times a day (TID) | ORAL | Status: DC

## 2014-02-13 NOTE — Progress Notes (Signed)
Patient is here to establish care today, she complains of left ear pain(slight pain) with dizziness x2 weeks. No depression. A1C ran today due to patient stating that there is a family hx of diabetes(father)

## 2014-02-13 NOTE — Progress Notes (Signed)
Patient ID: Colleen Bailey, female   DOB: [birth year], 49 y.o.   MRN: [id number]  KGU:[id number]  [id number]:[id number]  DOB - 18-Jul-1965  CC:  Chief Complaint  Patient presents with  . Establish Care  . Ear Pain       HPI: Colleen Bailey is a 49 y.o. female here today to establish medical care.  Patient has a past medical history of elevated BP and anemia.  She states that she has been having symptoms of slight left ear pain and dizziness. She has had this ear pain for the past two weeks. She believes that the pain may be due to a toothache.  She is due to have several back teeth removed but she has been unable to afford the procedure.  She denies fever, chills, nausea, vomiting, headaches.  She reports that she was diagnosed with hypertension last year but never took the prescribed medication.  The patient believes that her BP is only elevated when she gets worried.  Patient has No chest pain/palpitations, No abdominal pain - No Nausea, No new weakness tingling or numbness, No Cough - SOB.  No Known Allergies Past Medical History  Diagnosis Date  . Elevated BP 03/07/2013  . Anemia 12/27/2012   No current outpatient prescriptions on file prior to visit.   No current facility-administered medications on file prior to visit.   Family History  Problem Relation Age of Onset  . Cancer Neg Hx   . CAD Neg Hx   . Diabetes Father    History   Social History  . Marital Status: Legally Separated    Spouse Name: N/A    Number of Children: 3  . Years of Education: N/A   Occupational History  . works at [employer] Topics  . Smoking status: Never Smoker   . Smokeless tobacco: Never Used  . Alcohol Use: No     Comment: rare  . Drug Use: No  . Sexual Activity: No   Other Topics Concern  . Not on file   Social History Narrative   Original from [place] w/ son   Lives in [place] since ~ 2006    Review of Systems: Constitutional: Negative for  fever, chills, diaphoresis, activity change, appetite change and fatigue. HENT: Negative for ear pain, nosebleeds, congestion, facial swelling, rhinorrhea, neck pain, neck stiffness and ear discharge.  Eyes: Negative for pain, discharge, redness, itching and visual disturbance. Respiratory: Negative for cough, choking, chest tightness, shortness of breath, wheezing and stridor.  Cardiovascular: Negative for chest pain, palpitations and leg swelling. Gastrointestinal: Negative for abdominal distention. Genitourinary: Negative for dysuria, urgency, frequency, hematuria, flank pain, decreased urine volume, difficulty urinating and dyspareunia.  Musculoskeletal: Negative for back pain, joint swelling, arthralgia and gait problem. Neurological: Negative for dizziness, tremors, seizures, syncope, facial asymmetry, speech difficulty, weakness, light-headedness, numbness and headaches.  Hematological: Negative for adenopathy. Does not bruise/bleed easily. Psychiatric/Behavioral: Negative for hallucinations, behavioral problems, confusion, dysphoric mood, decreased concentration and agitation.    Objective:   Filed Vitals:   02/13/14 0917  BP: 138/87  Pulse: 71  Temp: 98.6 F (37 C)    Physical Exam: Constitutional: Patient appears well-developed and well-nourished. No distress. HENT: Normocephalic, atraumatic, External right and left ear normal. Oropharynx is clear and moist.  Eyes: Conjunctivae and EOM are normal. PERRLA, no scleral icterus. Neck: Normal ROM. CVS: RRR, S1/S2 +, no murmurs, no gallops, no carotid bruit.  Pulmonary: Effort and breath sounds normal, no stridor,  rhonchi, wheezes, rales.  Abdominal: Soft. BS +, no distension, tenderness, rebound or guarding.  Musculoskeletal: Normal range of motion. No edema and no tenderness.  Neuro: Alert. Normal reflexes Skin: Skin is warm and dry. No rash noted. Not diaphoretic. No erythema. No pallor. Psychiatric: Normal mood and affect.  Behavior, judgment, thought content normal.  Lab Results  Component Value Date   WBC 6.3 05/26/2013   HGB 11.1* 05/26/2013   HCT 33.7* 05/26/2013   MCV 80.9 05/26/2013   PLT 433.0* 05/26/2013   Lab Results  Component Value Date   CREATININE 0.49* 08/30/2012   BUN 9 08/30/2012   NA 137 08/30/2012   K 3.9 08/30/2012   CL 104 08/30/2012   CO2 28 08/30/2012    No results found for: HGBA1C Lipid Panel     Component Value Date/Time   CHOL 173 08/30/2012 1217       Assessment and plan:   Autry was seen today for establish care and ear pain.  Diagnoses and associated orders for this visit:  Toothache - amoxicillin (AMOXIL) 500 MG capsule; Take 1 capsule (500 mg total) by mouth 3 (three) times daily. - ibuprofen (ADVIL,MOTRIN) 600 MG tablet; Take 1 tablet (600 mg total) by mouth every 8 (eight) hours as needed. Patient given list of low cost dentist that may accept her insurance  Dizziness Likely due to infection   Family history of diabetes mellitus - HgB A1c--5.8%   Return if symptoms worsen or fail to improve.  The patient was given clear instructions to return to medical center if symptoms don't improve, worsen or new problems develop. The patient verbalized understanding. The patient was told to call to get lab results if they haven't heard anything in the next week.     Chari Manning, NP-C [facility] [phone] 02/13/2014, 9:29 AM

## 2014-02-13 NOTE — Patient Instructions (Signed)
Dental Pain  Toothache is pain in or around a tooth. It may get worse with chewing or with cold or heat.   HOME CARE  · Your dentist may use a numbing medicine during treatment. If so, you may need to avoid eating until the medicine wears off. Ask your dentist about this.  · Only take medicine as told by your dentist or doctor.  · Avoid chewing food near the painful tooth until after all treatment is done. Ask your dentist about this.  GET HELP RIGHT AWAY IF:   · The problem gets worse or new problems appear.  · You have a fever.  · There is redness and puffiness (swelling) of the face, jaw, or neck.  · You cannot open your mouth.  · There is pain in the jaw.  · There is very bad pain that is not helped by medicine.  MAKE SURE YOU:   · Understand these instructions.  · Will watch your condition.  · Will get help right away if you are not doing well or get worse.  Document Released: 06/18/2007 Document Revised: 03/24/2011 Document Reviewed: 06/18/2007  ExitCare® Patient Information ©2015 [employer]. This information is not intended to replace advice given to you by your health care provider. Make sure you discuss any questions you have with your health care provider.

## 2014-03-17 ENCOUNTER — Ambulatory Visit: Payer: Managed Care, Other (non HMO) | Attending: Internal Medicine | Admitting: Internal Medicine

## 2014-03-17 ENCOUNTER — Encounter: Payer: Self-pay | Admitting: Internal Medicine

## 2014-03-17 VITALS — BP 140/85 | HR 65 | Temp 98.8°F | Resp 16 | Wt 149.2 lb

## 2014-03-17 DIAGNOSIS — R1011 Right upper quadrant pain: Secondary | ICD-10-CM | POA: Insufficient documentation

## 2014-03-17 DIAGNOSIS — M25512 Pain in left shoulder: Secondary | ICD-10-CM | POA: Insufficient documentation

## 2014-03-17 DIAGNOSIS — R1013 Epigastric pain: Secondary | ICD-10-CM | POA: Diagnosis present

## 2014-03-17 MED ORDER — TRAMADOL HCL 50 MG PO TABS: 50.0000 mg | ORAL_TABLET | Freq: Two times a day (BID) | ORAL | Status: DC | PRN

## 2014-03-17 NOTE — Progress Notes (Signed)
Patient complains of epigastric pain States after she eats she has to go to the bathroom Patient also complains of having left shoulder pain

## 2014-03-17 NOTE — Progress Notes (Signed)
Patient ID: Colleen Bailey, female   DOB: 26-Nov-1965, 49 y.o.   MRN: [id number]  CC: abdominal pain, shoulder pain  HPI: Colleen Bailey is a 49 y.o. female here today for a follow up visit.  Patient has past medical history of anemia. Patient reports that she has been having epigastric and RUQ pain for the past one month. She states that whenever she eats she has to go the bathroom within the next 10 minutes. Her stomach makes a growling noise and feels crampy. She had her gall bladder removed last year. She states that she has been eating very heavy and greasy foods.   She has been having left shoulder pain for three months. She states that she had cortisone injections two months ago. After the injections she felt better until 2 weeks ago when she was carrying her heavy grand-baby. The pain is achy and worse at night. It radiates to her neck and back.   Patient has No headache, No chest pain, No new weakness tingling or numbness, No Cough - SOB.  No Known Allergies Past Medical History  Diagnosis Date  . Elevated BP 03/07/2013  . Anemia 12/27/2012   Current Outpatient Prescriptions on File Prior to Visit  Medication Sig Dispense Refill  . amoxicillin (AMOXIL) 500 MG capsule Take 1 capsule (500 mg total) by mouth 3 (three) times daily. 30 capsule 0  . ibuprofen (ADVIL,MOTRIN) 600 MG tablet Take 1 tablet (600 mg total) by mouth every 8 (eight) hours as needed. 30 tablet 0   No current facility-administered medications on file prior to visit.   Family History  Problem Relation Age of Onset  . Cancer Neg Hx   . CAD Neg Hx   . Diabetes Father    History   Social History  . Marital Status: Legally Separated    Spouse Name: N/A  . Number of Children: 3  . Years of Education: N/A   Occupational History  . works at [employer] Topics  . Smoking status: Never Smoker   . Smokeless tobacco: Never Used  . Alcohol Use: No     Comment: rare  . Drug Use: No  .  Sexual Activity: No   Other Topics Concern  . Not on file   Social History Narrative   Original from [place] w/ son   Lives in [place] since ~ 2006    Review of Systems  Gastrointestinal: Positive for abdominal pain and diarrhea. Negative for heartburn.  Musculoskeletal: Positive for joint pain.  All other systems reviewed and are negative.    Objective:   Filed Vitals:   03/17/14 1425  BP: 153/90  Pulse: 65  Temp: 98.8 F (37.1 C)  Resp: 16    Physical Exam  Constitutional: She is oriented to person, place, and time.  Cardiovascular: Normal rate, regular rhythm and normal heart sounds.   Pulmonary/Chest: Effort normal and breath sounds normal.  Abdominal: Soft. Bowel sounds are normal. She exhibits no distension. There is no tenderness.  Musculoskeletal: Normal range of motion. She exhibits no edema or tenderness.  Neurological: She is oriented to person, place, and time.   .  Lab Results  Component Value Date   WBC 6.3 05/26/2013   HGB 11.1* 05/26/2013   HCT 33.7* 05/26/2013   MCV 80.9 05/26/2013   PLT 433.0* 05/26/2013   Lab Results  Component Value Date   CREATININE 0.49* 08/30/2012   BUN 9 08/30/2012   NA 137  08/30/2012   K 3.9 08/30/2012   CL 104 08/30/2012   CO2 28 08/30/2012    Lab Results  Component Value Date   HGBA1C 5.80 02/13/2014   Lipid Panel     Component Value Date/Time   CHOL 173 08/30/2012 1217       Assessment and plan:   Lexii was seen today for abdominal pain.  Diagnoses and all orders for this visit:  Left shoulder pain Orders: -     traMADol (ULTRAM) 50 MG tablet; Take 1 tablet (50 mg total) by mouth every 12 (twelve) hours as needed.  Right upper quadrant pain Explained to patient that she needs to cut back on heavy, greasy meals. It may be contributing to her pain and diarrhea since she had her gallbladder removed.   Return if symptoms worsen or fail to improve.      Chari Manning,  NP-C [facility] [phone] 03/17/2014, 2:48 PM

## 2014-03-17 NOTE — Patient Instructions (Signed)
Low-Fat Diet for Pancreatitis or Gallbladder Conditions °A low-fat diet can be helpful if you have pancreatitis or a gallbladder condition. With these conditions, your pancreas and gallbladder have trouble digesting fats. A healthy eating plan with less fat will help rest your pancreas and gallbladder and reduce your symptoms. °WHAT DO I NEED TO KNOW ABOUT THIS DIET? °· Eat a low-fat diet. °¨ Reduce your fat intake to less than 20-30% of your total daily calories. This is less than 50-60 g of fat per day. °¨ Remember that you need some fat in your diet. Ask your dietician what your daily goal should be. °¨ Choose nonfat and low-fat healthy foods. Look for the words "nonfat," "low fat," or "fat free." °¨ As a guide, look on the label and choose foods with less than 3 g of fat per serving. Eat only one serving. °· Avoid alcohol. °· Do not smoke. If you need help quitting, talk with your health care provider. °· Eat small frequent meals instead of three large heavy meals. °WHAT FOODS CAN I EAT? °Grains °Include healthy grains and starches such as potatoes, wheat bread, fiber-rich cereal, and brown rice. Choose whole grain options whenever possible. In adults, whole grains should account for 45-65% of your daily calories.  °Fruits and Vegetables °Eat plenty of fruits and vegetables. Fresh fruits and vegetables add fiber to your diet. °Meats and Other Protein Sources °Eat lean meat such as chicken and pork. Trim any fat off of meat before cooking it. Eggs, fish, and beans are other sources of protein. In adults, these foods should account for 10-35% of your daily calories. °Dairy °Choose low-fat milk and dairy options. Dairy includes fat and protein, as well as calcium.  °Fats and Oils °Limit high-fat foods such as fried foods, sweets, baked goods, sugary drinks.  °Other °Creamy sauces and condiments, such as mayonnaise, can add extra fat. Think about whether or not you need to use them, or use smaller amounts or low fat  options. °WHAT FOODS ARE NOT RECOMMENDED? °· High fat foods, such as: °¨ Baked goods. °¨ Ice cream. °¨ French toast. °¨ Sweet rolls. °¨ Pizza. °¨ Cheese bread. °¨ Foods covered with batter, butter, creamy sauces, or cheese. °¨ Fried foods. °¨ Sugary drinks and desserts. °· Foods that cause gas or bloating °Document Released: 01/04/2013 Document Reviewed: 01/04/2013 °[employer]® Patient Information ©2015 [employer]. This information is not intended to replace advice given to you by your health care provider. Make sure you discuss any questions you have with your health care provider. ° °

## 2014-05-19 ENCOUNTER — Ambulatory Visit: Payer: Managed Care, Other (non HMO) | Attending: Internal Medicine | Admitting: Internal Medicine

## 2014-05-19 ENCOUNTER — Encounter: Payer: Self-pay | Admitting: Internal Medicine

## 2014-05-19 VITALS — BP 139/84 | HR 67 | Temp 98.4°F | Resp 16 | Ht 59.0 in | Wt 146.0 lb

## 2014-05-19 DIAGNOSIS — M25562 Pain in left knee: Secondary | ICD-10-CM | POA: Insufficient documentation

## 2014-05-19 DIAGNOSIS — K0889 Other specified disorders of teeth and supporting structures: Secondary | ICD-10-CM

## 2014-05-19 DIAGNOSIS — I951 Orthostatic hypotension: Secondary | ICD-10-CM | POA: Diagnosis not present

## 2014-05-19 DIAGNOSIS — R42 Dizziness and giddiness: Secondary | ICD-10-CM | POA: Diagnosis not present

## 2014-05-19 DIAGNOSIS — K088 Other specified disorders of teeth and supporting structures: Secondary | ICD-10-CM

## 2014-05-19 DIAGNOSIS — M25512 Pain in left shoulder: Secondary | ICD-10-CM | POA: Insufficient documentation

## 2014-05-19 MED ORDER — MELOXICAM 7.5 MG PO TABS: 7.5000 mg | ORAL_TABLET | Freq: Every day | ORAL | Status: DC

## 2014-05-19 NOTE — Progress Notes (Signed)
Pt is here today c/o pain in her left shoulder and left knee. Pt reports being dizzy sometimes.

## 2014-05-19 NOTE — Progress Notes (Signed)
Patient ID: Colleen Bailey, female   DOB: 1965/10/11, 49 y.o.   MRN: [id number]  CC: left shoulder/knee pain  HPI: Colleen Bailey is a 49 y.o. female here today for a follow up visit.  Patient presents for left shoulder and knee pain.  Unable to sleep because of the pain.  Pain begins in her left shoulder but feels like it is radiating into her chest. She denies SOB. The left knee described as achy.  She reports that it is more painful to lift the knee.  The pain medication she was given was too strong so she stopped taking it.    Dizziness for the past two weeks. She reports that she only has dizziness with standing.  No falls or headaches. Sometimes dizzy while driving. Reports that she needs glasses and thinks that it may be causing her to get dizzy.   Patient has No headache, No chest pain, No abdominal pain - No Nausea, No new weakness tingling or numbness, No Cough - SOB.  No Known Allergies Past Medical History  Diagnosis Date  . Elevated BP 03/07/2013  . Anemia 12/27/2012   Current Outpatient Prescriptions on File Prior to Visit  Medication Sig Dispense Refill  . ibuprofen (ADVIL,MOTRIN) 600 MG tablet Take 1 tablet (600 mg total) by mouth every 8 (eight) hours as needed. 30 tablet 0  . amoxicillin (AMOXIL) 500 MG capsule Take 1 capsule (500 mg total) by mouth 3 (three) times daily. (Patient not taking: Reported on 05/19/2014) 30 capsule 0  . traMADol (ULTRAM) 50 MG tablet Take 1 tablet (50 mg total) by mouth every 12 (twelve) hours as needed. (Patient not taking: Reported on 05/19/2014) 30 tablet 0   No current facility-administered medications on file prior to visit.   Family History  Problem Relation Age of Onset  . Cancer Neg Hx   . CAD Neg Hx   . Diabetes Father    History   Social History  . Marital Status: Legally Separated    Spouse Name: N/A  . Number of Children: 3  . Years of Education: N/A   Occupational History  . works at [employer]  Topics  . Smoking status: Never Smoker   . Smokeless tobacco: Never Used  . Alcohol Use: No     Comment: rare  . Drug Use: No  . Sexual Activity: No   Other Topics Concern  . Not on file   Social History Narrative   Original from [place] w/ son   Lives in [place] since ~ 2006    Review of Systems: See HPI   Objective:   Filed Vitals:   05/19/14 1120  BP: 135/81  Pulse: 70  Temp: 98.4 F (36.9 C)  Resp: 16    Physical Exam  Constitutional: She is oriented to person, place, and time.  Eyes: EOM are normal. Pupils are equal, round, and reactive to light.  Neck: Normal range of motion.  Cardiovascular: Normal rate, regular rhythm and normal heart sounds.   Pulmonary/Chest: Effort normal and breath sounds normal.  Musculoskeletal: She exhibits no edema.  Neurological: She is alert and oriented to person, place, and time. No cranial nerve deficit.     Lab Results  Component Value Date   WBC 6.3 05/26/2013   HGB 11.1* 05/26/2013   HCT 33.7* 05/26/2013   MCV 80.9 05/26/2013   PLT 433.0* 05/26/2013   Lab Results  Component Value Date   CREATININE 0.49* 08/30/2012  BUN 9 08/30/2012   NA 137 08/30/2012   K 3.9 08/30/2012   CL 104 08/30/2012   CO2 28 08/30/2012    Lab Results  Component Value Date   HGBA1C 5.80 02/13/2014   Lipid Panel     Component Value Date/Time   CHOL 173 08/30/2012 1217       Assessment and plan:   Colleen Bailey was seen today for follow-up.  Diagnoses and all orders for this visit:  Left shoulder pain Orders: -     AMB referral to orthopedics -     meloxicam (MOBIC) 7.5 MG tablet; Take 1 tablet (7.5 mg total) by mouth daily.  Left knee pain See above  Toothache Dental referral list given and referral placed   Dizziness Orders: -     Orthostatic vital signs; Standing -     Orthostatic vital signs  Orthostatic hypotension Patient displayed orthostatic hypotension. I have given her instructions to help  such as increase salt intake over next couple of days and stay hydrated. If symptoms persist she will need to return. She is currently no on any anti-hypertensives     Return if symptoms worsen or fail to improve.   Chari Manning, NP-C [facility] [phone] 05/19/2014, 12:04 PM

## 2014-05-19 NOTE — Patient Instructions (Signed)

## 2014-06-09 ENCOUNTER — Ambulatory Visit (INDEPENDENT_AMBULATORY_CARE_PROVIDER_SITE_OTHER): Payer: Managed Care, Other (non HMO) | Admitting: Family Medicine

## 2014-06-09 ENCOUNTER — Encounter: Payer: Self-pay | Admitting: Family Medicine

## 2014-06-09 VITALS — BP 86/42 | Ht 60.0 in | Wt 144.0 lb

## 2014-06-09 DIAGNOSIS — M7522 Bicipital tendinitis, left shoulder: Secondary | ICD-10-CM

## 2014-06-09 DIAGNOSIS — M25512 Pain in left shoulder: Secondary | ICD-10-CM | POA: Diagnosis not present

## 2014-06-09 DIAGNOSIS — M25569 Pain in unspecified knee: Secondary | ICD-10-CM | POA: Insufficient documentation

## 2014-06-09 DIAGNOSIS — M25562 Pain in left knee: Secondary | ICD-10-CM

## 2014-06-09 MED ORDER — METHYLPREDNISOLONE ACETATE 40 MG/ML IJ SUSP
40.0000 mg | Freq: Once | INTRAMUSCULAR | Status: AC
  Administered 2014-06-09: 40 mg via INTRA_ARTICULAR

## 2014-06-09 NOTE — Assessment & Plan Note (Signed)
Biceps tendinitis. Corticosteroid-induced injection today. Placed her on home exercise program with bicep curls using an exercise band. Follow-up one month.

## 2014-06-09 NOTE — Assessment & Plan Note (Signed)
Think she has some inflammation underneath the left patella. This wound does seem to ride a little bit more laterally than her right one. We'll put her on some short arc quadriceps exercises doing 15-30 reps with 2-3 set today. Also put her on one set of step ups, laterally using a small stool and some knee extension. Discussed at length with her etiology of this. I'll see her back in 4-6 weeks. At that time a look for some improvement but I doubt she will have total resolution of her knee symptoms. I think this will be long-term chronic problem for her less she's particular day aggressive with her rehabilitation program.

## 2014-06-09 NOTE — Progress Notes (Signed)
Patient ID: Colleen Bailey, female   DOB: 12-Aug-1965, 49 y.o.   MRN: [id number]  Colleen Bailey - 49 y.o. female MRN [id number]  Date of birth: 11-03-1965    SUBJECTIVE:     #1. Marland Kitchen Complaining of left shoulder/upper arm pain. Been present several weeks. No inciting injury known. Right-hand dominant. 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 #2. Left knee pain. Worse if she is trying to go upstairs or sits for long period of time. After she is up and walking it hurts less. This is been bothering her for months. No known knee injury. ROS:     She's noted no fever, sweats, chills, unusual weight change. She's not in any swelling, erythema of the left upper arm or left knee area. She's not had any unusual rash. She has no other unusual joint or muscle pains.  PERTINENT  PMH / PSH FH / / SH:  Past Medical, Surgical, Social, and Family History Reviewed & Updated in the EMR.  Pertinent findings include:  No history of left shoulder or left knee injury or surgery. No personal history diabetes mellitus. She does have a history of chronic cholecystitis with calculus. History of elevated blood pressure   OBJECTIVE: BP 86/42 mmHg  Ht 5' (1.524 m)  Wt 144 lb (65.318 kg)  BMI 28.12 kg/m2  LMP 05/18/2014  Physical Exam:  Vital signs are reviewed. GEN.: Well-developed female no acute distress Shoulder: Left. Full range of motion and strength in all planes the rotator cuff. She's tender to palpation over the biceps tendon in the upper portion of the biceps muscle. Bicep muscle strength is 5 out of 5. KNEES: Her patella on the left knee is very slightly off center laterally. She has normal range of motion bilateral knees. Ligaments intact to varus and valgus stress. Normal Lock[place]. The patellar tendon is mildly tender to palpation. Patellar grind test is positive. Quadricep muscles are symmetrical but lack much true muscle  bulk. VASCULAR: Dorsalis pedis and radial pulses are 2+ bilaterally symmetrical. NEURO: Intact sensation to soft touch distally in the hands and feet.  ULTRASOUND: Knee: Left. No fluid in the suprapatellar pouch. A very small amount of osteoarthritic changes noted on the underside of the patella but the troponin her groove his smooth and has adequate cartilage. Lateral and medial meniscus are seen and had no significant pathology although there are some osteophytes present at the joint line laterally.  Arm: Left bicep shows some edema in the bicep muscle itself. Is a small amount of fluid noted in the bicipital tendon sheath. The biceps tendon is intact.   INJECTION: Patient was given informed consent, signed copy in the chart. Appropriate time out was taken. Area prepped and draped in usual sterile fashion. 1 cc of methylprednisolone 40 mg/ml plus  1 cc of 1% lidocaine without epinephrine was injected into the left bicep tendon sheath using a(n) US guided approach. The patient tolerated the procedure well. There were no complications. Post procedure instructions were given.  ASSESSMENT & PLAN:  See problem based charting & AVS for pt instructions.

## 2014-07-19 ENCOUNTER — Other Ambulatory Visit: Payer: Self-pay

## 2014-07-19 DIAGNOSIS — Z1231 Encounter for screening mammogram for malignant neoplasm of breast: Secondary | ICD-10-CM

## 2014-08-11 ENCOUNTER — Encounter: Payer: Self-pay | Admitting: Family Medicine

## 2014-08-11 ENCOUNTER — Ambulatory Visit
Admission: RE | Admit: 2014-08-11 | Discharge: 2014-08-11 | Disposition: A | Discharge status: Home / Self Care | Payer: Managed Care, Other (non HMO) | Source: Ambulatory Visit | Attending: Family Medicine | Admitting: Family Medicine

## 2014-08-11 ENCOUNTER — Ambulatory Visit (INDEPENDENT_AMBULATORY_CARE_PROVIDER_SITE_OTHER): Payer: Managed Care, Other (non HMO) | Admitting: Family Medicine

## 2014-08-11 VITALS — BP 85/60 | Ht 60.0 in | Wt 145.0 lb

## 2014-08-11 DIAGNOSIS — M25512 Pain in left shoulder: Secondary | ICD-10-CM | POA: Diagnosis not present

## 2014-08-11 DIAGNOSIS — M25562 Pain in left knee: Secondary | ICD-10-CM

## 2014-08-11 MED ORDER — METHYLPREDNISOLONE ACETATE 40 MG/ML IJ SUSP
40.0000 mg | Freq: Once | INTRAMUSCULAR | Status: AC
  Administered 2014-08-11: 40 mg via INTRA_ARTICULAR

## 2014-08-13 NOTE — Assessment & Plan Note (Signed)
She improved about 50% with CSI bicep tendon sheath. Today we did subacromial bursa injection. F/u in a couple of weeks for her knee and we will check her shoulder progress at that time.

## 2014-08-13 NOTE — Assessment & Plan Note (Signed)
She did not do well with HEP. Will get MRI to see if there is compnent of patellar OA

## 2014-08-13 NOTE — Progress Notes (Signed)
   Subjective:    Patient ID: Colleen Bailey, female    DOB: [birth year], 49 y.o.   MRN: [id number]  HPI  1. F/u left shoulder pain> at last ov I injected her bicep tendon sheath and that has improved her pain about 50%. She is still having pain with overhead activity and pain at night when trying to sleep (cannot find comfortable position). Right hand dominant. 2. LEFT Knee pain is no better, in fact she thinks the exercises were making it worse so she stopped doingthem  Review of Systems No fever, sweats, chills or unusual weight loss. No numbess or tinging in extermities.    Objective:   Physical Exam VS reviewed WD WN NAD Left shoulder : pain with supraspinatus testing bt her strength is intact in alp[lanes of rotator cuff. Lift off test, IR and ER are painless. AC joint area is mildly ttp but this does not reproduce her pain KNEE LEFT: crepitus on extension. FROM in extension and flexion. Ligamentously inact to varus and valgus stress. Painful patellar grind. Patella tracking is more lateral than right.       Assessment & Plan:

## 2014-08-15 NOTE — Patient Instructions (Signed)
PA# [id number] for MRI of the Left Knee

## 2014-08-19 ENCOUNTER — Inpatient Hospital Stay: Admission: RE | Admit: 2014-08-19 | Payer: Managed Care, Other (non HMO) | Source: Ambulatory Visit

## 2014-08-21 ENCOUNTER — Ambulatory Visit
Admission: RE | Admit: 2014-08-21 | Discharge: 2014-08-21 | Disposition: A | Discharge status: Home / Self Care | Payer: Managed Care, Other (non HMO) | Source: Ambulatory Visit

## 2014-08-21 DIAGNOSIS — Z1231 Encounter for screening mammogram for malignant neoplasm of breast: Secondary | ICD-10-CM

## 2014-08-25 ENCOUNTER — Ambulatory Visit: Payer: Managed Care, Other (non HMO) | Admitting: Internal Medicine

## 2014-08-28 ENCOUNTER — Inpatient Hospital Stay: Admission: RE | Admit: 2014-08-28 | Payer: Managed Care, Other (non HMO) | Source: Ambulatory Visit

## 2014-09-04 ENCOUNTER — Encounter: Payer: Self-pay | Admitting: Internal Medicine

## 2014-09-04 ENCOUNTER — Ambulatory Visit: Payer: Managed Care, Other (non HMO) | Attending: Internal Medicine | Admitting: Internal Medicine

## 2014-09-04 VITALS — BP 114/81 | HR 62 | Temp 98.0°F | Resp 16 | Ht 60.0 in | Wt 141.8 lb

## 2014-09-04 DIAGNOSIS — K5900 Constipation, unspecified: Secondary | ICD-10-CM | POA: Diagnosis not present

## 2014-09-04 DIAGNOSIS — R109 Unspecified abdominal pain: Secondary | ICD-10-CM | POA: Diagnosis present

## 2014-09-04 DIAGNOSIS — R350 Frequency of micturition: Secondary | ICD-10-CM | POA: Insufficient documentation

## 2014-09-04 DIAGNOSIS — R1012 Left upper quadrant pain: Secondary | ICD-10-CM | POA: Diagnosis not present

## 2014-09-04 LAB — POCT URINALYSIS DIPSTICK
BILIRUBIN UA: NEGATIVE
Glucose, UA: NEGATIVE
Ketones, UA: NEGATIVE
LEUKOCYTES UA: NEGATIVE
NITRITE UA: NEGATIVE
PH UA: 6.5
PROTEIN UA: NEGATIVE
Spec Grav, UA: 1.01
UROBILINOGEN UA: 0.2

## 2014-09-04 MED ORDER — POLYETHYLENE GLYCOL 3350 17 GM/SCOOP PO POWD: 17.0000 g | Freq: Every day | ORAL | Status: DC

## 2014-09-04 NOTE — Progress Notes (Signed)
   Subjective:    Patient ID: Colleen Bailey, female    DOB: 05/13/1965, 49 y.o.   MRN: [id number]  HPI Comments: Feels like she voids several times per night and throughout the day. No fever, chills, dysuria, or pressure. Some urgency. No stress incontinence.   Abdominal Pain This is a new problem. The current episode started 1 to 4 weeks ago. The problem occurs every several days. The problem has been unchanged. The abdominal pain radiates to the LUQ and left flank. Associated symptoms include constipation (sometimes 3-4 days between bowel movement), flatus and frequency. Pertinent negatives include no dysuria, nausea or vomiting. Associated symptoms comments: Bloated, urinary frequency . Nothing aggravates the pain. The pain is relieved by nothing. She has tried nothing for the symptoms. Her past medical history is significant for abdominal surgery (gallbladder removal) and irritable bowel syndrome. There is no history of gallstones.    Review of Systems  Gastrointestinal: Positive for abdominal pain, constipation (sometimes 3-4 days between bowel movement) and flatus. Negative for nausea and vomiting.  Genitourinary: Positive for frequency. Negative for dysuria, vaginal discharge, menstrual problem, pelvic pain and dyspareunia.  All other systems reviewed and are negative.     Objective:   Physical Exam  Constitutional: She is oriented to person, place, and time. She appears well-nourished.  Cardiovascular: Normal rate, regular rhythm and normal heart sounds.   Pulmonary/Chest: Effort normal and breath sounds normal.  Abdominal: Soft. Bowel sounds are normal. She exhibits no distension. There is no tenderness.  Neurological: She is alert and oriented to person, place, and time.  Skin: Skin is warm and dry. She is not diaphoretic.      Assessment & Plan:  Shona was seen today for abdominal pain.  Diagnoses and all orders for this visit:  Left upper quadrant pain Likely a result  of constipation   Constipation, unspecified constipation type -     polyethylene glycol powder (GLYCOLAX/MIRALAX) powder; Take 17 g by mouth daily. Explained that the daily recommended amount of fiber is 25 g, went over high fiber foods, encourage increased water intake, miralax use, and increased physical activity.  Patient given constipation handout.   Urinary frequency -     Urinalysis Dipstick--negative Differential diagnoses---bladder prolapse, interstitial cystitis    Return if symptoms worsen or fail to improve.  Lance Bosch, NP 09/04/2014 3:37 PM

## 2014-09-04 NOTE — Progress Notes (Signed)
Patient complains of having left sided Abd pain that  Started about two weeks ago Patient states she has been constipated and also  Having to get a lot during the night to void

## 2014-09-04 NOTE — Patient Instructions (Signed)

## 2014-10-27 ENCOUNTER — Encounter: Payer: Self-pay | Admitting: Internal Medicine

## 2014-10-27 ENCOUNTER — Ambulatory Visit: Payer: Managed Care, Other (non HMO) | Attending: Internal Medicine | Admitting: Internal Medicine

## 2014-10-27 VITALS — BP 117/75 | HR 69 | Temp 98.0°F | Resp 16 | Ht 60.0 in | Wt 143.6 lb

## 2014-10-27 DIAGNOSIS — R519 Headache, unspecified: Secondary | ICD-10-CM

## 2014-10-27 DIAGNOSIS — R51 Headache: Secondary | ICD-10-CM

## 2014-10-27 NOTE — Progress Notes (Signed)
Patient states she is here for her blood pressure Patient states last week her blood pressure was elevated And she was having a headache and had to leave work early

## 2014-10-27 NOTE — Progress Notes (Signed)
[id number] ID: Colleen Bailey, female   DOB: 1965/02/13, 49 y.o.   MRN: [id number]  CC: headaches, BP  HPI: Colleen Bailey is a 49 y.o. female here today for a follow up visit.  Patient has no past medical history. Patient reports that she was at work last week and began to have a severe headache. At that time she checked her blood pressure and states that it was elevated but she does not remember the exact numbers. She states that she felt so bad she left work early. Patient is concerned that she may have hypertension. When she has headaches she feels dizzy. She takes tylenol for headaches which works well for her.   Patient has No headache, No chest pain, No abdominal pain - No Nausea, No new weakness tingling or numbness, No Cough - SOB.  No Known Allergies Past Medical History  Diagnosis Date  . Elevated BP 03/07/2013  . Anemia 12/27/2012   Current Outpatient Prescriptions on File Prior to Visit  Medication Sig Dispense Refill  . ibuprofen (ADVIL,MOTRIN) 600 MG tablet Take 1 tablet (600 mg total) by mouth every 8 (eight) hours as needed. 30 tablet 0  . meloxicam (MOBIC) 7.5 MG tablet Take 1 tablet (7.5 mg total) by mouth daily. 30 tablet 1  . polyethylene glycol powder (GLYCOLAX/MIRALAX) powder Take 17 g by mouth daily. 850 g 1  . traMADol (ULTRAM) 50 MG tablet Take 50 mg by mouth every 12 (twelve) hours as needed.  0   No current facility-administered medications on file prior to visit.   Family History  Problem Relation Age of Onset  . Cancer Neg Hx   . CAD Neg Hx   . Diabetes Father    Social History   Social History  . Marital Status: Legally Separated    Spouse Name: N/A  . Number of Children: 3  . Years of Education: N/A   Occupational History  . works at [employer] Topics  . Smoking status: Never Smoker   . Smokeless tobacco: Never Used  . Alcohol Use: No     Comment: rare  . Drug Use: No  . Sexual Activity: No   Other Topics Concern  .  Not on file   Social History Narrative   Original from [place] w/ son   Lives in [place] since ~ 2006    Review of Systems: Other than what is stated in HPI, all other systems are negative.   Objective:   Filed Vitals:   10/27/14 0949  BP: 117/75  Pulse: 69  Temp:   Resp:     Physical Exam  Cardiovascular: Normal rate, regular rhythm and normal heart sounds.   Pulmonary/Chest: Effort normal and breath sounds normal.  Musculoskeletal: She exhibits no edema.  Neurological: No cranial nerve deficit.  Skin: Skin is warm and dry.     Lab Results  Component Value Date   WBC 6.3 05/26/2013   HGB 11.1* 05/26/2013   HCT 33.7* 05/26/2013   MCV 80.9 05/26/2013   PLT 433.0* 05/26/2013   Lab Results  Component Value Date   CREATININE 0.49* 08/30/2012   BUN 9 08/30/2012   NA 137 08/30/2012   K 3.9 08/30/2012   CL 104 08/30/2012   CO2 28 08/30/2012    Lab Results  Component Value Date   HGBA1C 5.80 02/13/2014   Lipid Panel     Component Value Date/Time   CHOL 173 08/30/2012 1217  Assessment and plan:   [name] was seen today for follow-up.  Diagnoses and all orders for this visit:  Nonintractable headache, unspecified chronicity pattern, unspecified headache type Patients BP is normal and has been normal at every visit. I have explained that she may go to Pharmacy and check BP twice per week. I have offered patient medication for headache but she refused and said Tylenol works well for her. Patient will stay hydrate and not skip meals.      May follow up as needed.   Lance Bosch, [facility] [phone] 10/27/2014, 1:45 PM

## 2014-11-08 ENCOUNTER — Encounter: Payer: Managed Care, Other (non HMO) | Admitting: Gynecology

## 2014-11-09 ENCOUNTER — Ambulatory Visit (INDEPENDENT_AMBULATORY_CARE_PROVIDER_SITE_OTHER): Payer: Managed Care, Other (non HMO) | Admitting: Gynecology

## 2014-11-09 ENCOUNTER — Encounter: Payer: Self-pay | Admitting: Gynecology

## 2014-11-09 VITALS — BP 130/84 | Ht 58.75 in | Wt 143.0 lb

## 2014-11-09 DIAGNOSIS — N3281 Overactive bladder: Secondary | ICD-10-CM

## 2014-11-09 DIAGNOSIS — Z01419 Encounter for gynecological examination (general) (routine) without abnormal findings: Secondary | ICD-10-CM | POA: Diagnosis not present

## 2014-11-09 DIAGNOSIS — Z862 Personal history of diseases of the blood and blood-forming organs and certain disorders involving the immune mechanism: Secondary | ICD-10-CM | POA: Diagnosis not present

## 2014-11-09 MED ORDER — MIRABEGRON ER 25 MG PO TB24: 25.0000 mg | ORAL_TABLET | Freq: Every day | ORAL | Status: DC

## 2014-11-09 NOTE — Patient Instructions (Signed)
Mirabegron extended-release tablets Qu es este medicamento? El MIRABEGRON se utiliza para tratar la vejiga hiperactiva. Este medicamento reduce la necesidad de Garment/textile technologist con frecuencia. Tambin puede ayudar a evitar que una persona se orine accidentalmente. Este medicamento puede ser utilizado para otros usos; si tiene alguna pregunta consulte con su proveedor de atencin mdica o con su farmacutico. Qu le debo informar a mi profesional de la salud antes de tomar este medicamento? Necesita saber si usted presenta alguno de los siguientes problemas o situaciones: dificultad para orinar alta presin sangunea enfermedad renal enfermedad heptica una reaccin alrgica o inusual al mirabegron, a otros medicamentos, alimentos, colorantes o conservantes si est embarazada o buscando quedar embarazada si est amamantando a un beb Cmo debo utilizar este medicamento? Tome este medicamento por va oral con un vaso de agua. Siga las instrucciones de la etiqueta del [place]. No corte, triture ni [employer]. Usted puede tomar Coca[employer]Cola con o sin alimentos. Si le produce malestar estomacal, tmelo con alimentos. Tome su medicamento a intervalos regulares. No tome su medicamento con una frecuencia mayor a la indicada. No deje de tomarlo excepto si as lo indica su mdico. Hable con su pediatra para informarse acerca del uso de este medicamento en nios. Puede requerir atencin especial. Sobredosis: Pngase en contacto inmediatamente con un centro toxicolgico o una sala de urgencia si usted cree que haya tomado demasiado medicamento. ATENCIN: [employer] es solo para usted. No comparta este medicamento con nadie. Qu sucede si me olvido de una dosis? Si se olvida una dosis, tmela lo antes posible. Si es casi la hora de la prxima dosis, tome slo esa dosis. No tome dosis adicionales o dobles. Qu puede interactuar con este medicamento? ciertos medicamentos para problemas de  vejiga, tales como fesoterodina, oxibutinina, solifenacina, tolterodina desipramina digoxina flecainida quetoconazol IMAOs, tales como Carbex, Eldepryl, Marplan, Nardil y Parnate metoprolol propafenona tioridazina warfarina Puede ser que esta lista no menciona todas las posibles interacciones. Informe a su profesional de [employer]	[place] de [employer] productos a base de hierbas, medicamentos de [place] o suplementos nutritivos que est tomando. Si usted fuma, consume bebidas alcohlicas o si utiliza drogas ilegales, indqueselo tambin a su profesional de [employer]	[place]. Algunas sustancias pueden interactuar con su medicamento. A qu debo estar atento al usar Coca-Cola? Pueden transcurrir 8 semanas antes de que obtenga el beneficio mximo de Coca-Cola. Es posible que sea necesario limitar la ingesta de t, caf, refrescos con cafena y alcohol. Estas bebidas pueden empeorar los sntomas. Visite a su mdico o a su profesional de la salud para chequear su evolucin peridicamente. Controle su presin sangunea como se le haya indicado. Pregunte a su mdico o a su profesional de la salud cul debe ser su presin sangunea y cundo deber comunicarse con l o ella. Qu efectos secundarios puedo tener al [employer] este medicamento? Efectos secundarios que debe informar a su mdico o a Barrister's clerk de la salud tan pronto como sea posible: Chief of Staff como erupcin cutnea, picazn o urticarias, hinchazn de la cara, labios o Holiday representative en el pecho o palpitaciones dolor de cabeza severa o repentina alta presin sangunea pulso cardiaco irregular, rpido enrojecimiento, formacin de ampollas, descamacin o distensin de la piel, inclusive dentro de la boca signos de una infeccin - fiebre o escalofros, dolor o dificultad para orina dificultad para orinar o cambios en el volumen de orina Efectos secundarios que, por lo general, no requieren atencin mdica (debe informarlos a su mdico o a su  profesional de la salud si persisten o si son molestos): estreimiento ojos secos dolor articular dolor de cabeza leve nuseas goteo de la nariz Puede ser que esta lista no menciona todos los posibles efectos secundarios. Comunquese a su mdico por asesoramiento mdico [employer] Usted puede informar los efectos secundarios a la FDA por telfono al 1-800-FDA-1088. Dnde debo guardar mi medicina? Mantngala fuera del alcance de los nios. Gurdela a [employer], entre 15 y 91 grados C (40 y 54 grados F). Deseche todo el medicamento que no haya utilizado, despus de la fecha de vencimiento. ATENCIN: Este folleto es un resumen. Puede ser que no cubra toda la posible informacin. Si usted tiene preguntas acerca de esta medicina, consulte con su mdico, su farmacutico o su profesional de Technical sales engineer.    2016, Elsevier/Gold Standard. (2014-02-22 00:00:00)

## 2014-11-09 NOTE — Progress Notes (Signed)
Colleen Bailey 1965/05/30 [id number]   History:    49 y.o.  for annual gyn exam with the only complaint is of getting up several times a night to urinate. During the day she urinates frequently but during the day she relates that she drinks a lot of fluid. Her nocturia is regardless of her fluid intake. She denies any urinary incontinence. She has not seen her PCP in a year and would like to have her blood work done here next week when she comes back fasting. She does have history of chronic anemia currently not taking supplemental iron.  Review of her record indicated in October 2015 and had an ultrasound as a result of low abdominal pelvic discomfort and her ultrasound had demonstrated the following:  Uterus measured 9.8 x 5.3 x 3.9 cm with endometrial stripe of 11 mm. Prominent endometrial avascular was noted. An intramural fibroid of 17 x 19 mm was noted. Right ovary was normal. Left ovary was normal. No fluid in the cul-de-sac. No apparent adnexal masses  She also had a benign endometrial biopsy at that office visit. Patient with no past history of any abnormal Pap smear. Patient reported normal menstrual cycles. Patient declined flu vaccine.   Past medical history,surgical history, family history and social history were all reviewed and documented in the EPIC chart.  Gynecologic History Patient's last menstrual period was 10/20/2014. Contraception: tubal ligation Last Pap: 2015 . Results were: normal Last mammogram: 2016. Results were: normal  Obstetric History OB History  Gravida Para Term Preterm AB SAB TAB Ectopic Multiple Living  5 3   2  2   3     # Outcome Date GA Lbr Len/2nd Weight Sex Delivery Anes PTL Lv  5 TAB           4 TAB           3 Para           2 Para           1 Para                ROS: A ROS was performed and pertinent positives and negatives are included in the history.  GENERAL: No fevers or chills. HEENT: No change in vision, no earache, sore  throat or sinus congestion. NECK: No pain or stiffness. CARDIOVASCULAR: No chest pain or pressure. No palpitations. PULMONARY: No shortness of breath, cough or wheeze. GASTROINTESTINAL: No abdominal pain, nausea, vomiting or diarrhea, melena or bright red blood per rectum. GENITOURINARY: No urinary frequency, urgency, hesitancy or dysuria. MUSCULOSKELETAL: No joint or muscle pain, no back pain, no recent trauma. DERMATOLOGIC: No rash, no itching, no lesions. ENDOCRINE: No polyuria, polydipsia, no heat or cold intolerance. No recent change in weight. HEMATOLOGICAL: No anemia or easy bruising or bleeding. NEUROLOGIC: No headache, seizures, numbness, tingling or weakness. PSYCHIATRIC: No depression, no loss of interest in normal activity or change in sleep pattern.     Exam: chaperone present  BP 130/84 mmHg  Ht 4' 10.75" (1.492 m)  Wt 143 lb (64.864 kg)  BMI 29.14 kg/m2  LMP 10/20/2014  Body mass index is 29.14 kg/(m^2).  General appearance : Well developed well nourished female. No acute distress HEENT: Eyes: no retinal hemorrhage or exudates,  Neck supple, trachea midline, no carotid bruits, no thyroidmegaly Lungs: Clear to auscultation, no rhonchi or wheezes, or rib retractions  Heart: Regular rate and rhythm, no murmurs or gallops Breast:Examined in sitting and supine position were symmetrical in  appearance, no palpable masses or tenderness,  no skin retraction, no nipple inversion, no nipple discharge, no skin discoloration, no axillary or supraclavicular lymphadenopathy Abdomen: no palpable masses or tenderness, no rebound or guarding Extremities: no edema or skin discoloration or tenderness  Pelvic:  Bartholin, Urethra, Skene Glands: Within normal limits             Vagina: No gross lesions or discharge  Cervix: No gross lesions or discharge  Uterus  anteverted , normal size, shape and consistency, non-tender and mobile  Adnexa  Without masses or tenderness  Anus and perineum  normal    Rectovaginal  normal sphincter tone without palpated masses or tenderness             Hemoccult not indicated    Assessment/Plan:  49 y.o. female for annual exam with signs and symptoms highly suspicious of overactive bladder. I'm going to provided with literature information on subject. 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 She will be prescribed Myrbetriq 25 mg daily. Risk benefits and pros and cons were discussed. Patient will return back to the office sometime next week in a fasting state for the following screening blood work: Comprehensive metabolic panel, fasting lipid profile, TSH, CBC, and urinalysis. Pap smear not done this year. Patient was reminded do her monthly breast exams. Patient will be referred to the plastic surgeon as a result of her pendulous breasts were chart call her back discomfort and neck pains.   Terrance Mass MD, 11:26 AM 11/09/2014

## 2014-11-10 ENCOUNTER — Telehealth: Payer: Self-pay | Admitting: *Deleted

## 2014-11-10 MED ORDER — TOLTERODINE TARTRATE ER 2 MG PO CP24: 2.0000 mg | ORAL_CAPSULE | Freq: Every day | ORAL | Status: DC

## 2014-11-10 NOTE — Telephone Encounter (Signed)
Pt medication for Myrbetriq 25 mg was denied by insurance pt has not tried/failed preferred drugs such as oxybutynin, tolterdine, trospium. Pt will need to have tried one of the recommended Rx. Please advise

## 2014-11-10 NOTE — Telephone Encounter (Signed)
C we will cover for Detrol LA 2 mg daily #30 with 11 refills

## 2014-11-10 NOTE — Telephone Encounter (Signed)
Rx sent 

## 2014-11-13 ENCOUNTER — Telehealth: Payer: Self-pay | Admitting: *Deleted

## 2014-11-13 ENCOUNTER — Other Ambulatory Visit: Payer: Managed Care, Other (non HMO)

## 2014-11-13 DIAGNOSIS — Z01419 Encounter for gynecological examination (general) (routine) without abnormal findings: Secondary | ICD-10-CM

## 2014-11-13 LAB — LIPID PANEL
Cholesterol: 202 mg/dL — ABNORMAL HIGH (ref 125–200)
HDL: 51 mg/dL (ref >=46)
LDL CALC: 114 mg/dL (ref <130)
TRIGLYCERIDES: 183 mg/dL — AB (ref <150)
Total CHOL/HDL Ratio: 4 Ratio (ref <=5.0)
VLDL: 37 mg/dL — AB (ref <30)

## 2014-11-13 LAB — CBC WITH DIFFERENTIAL/PLATELET
BASOS PCT: 0 % (ref 0–1)
Basophils Absolute: 0 10*3/uL (ref 0.0–0.1)
EOS PCT: 7 % — AB (ref 0–5)
Eosinophils Absolute: 0.5 10*3/uL (ref 0.0–0.7)
HEMATOCRIT: 37.3 % (ref 36.0–46.0)
HEMOGLOBIN: 12.4 g/dL (ref 12.0–15.0)
LYMPHS PCT: 42 % (ref 12–46)
Lymphs Abs: 2.9 10*3/uL (ref 0.7–4.0)
MCH: 26.2 pg (ref 26.0–34.0)
MCHC: 33.2 g/dL (ref 30.0–36.0)
MCV: 78.9 fL (ref 78.0–100.0)
MONOS PCT: 6 % (ref 3–12)
MPV: 8.1 fL — ABNORMAL LOW (ref 8.6–12.4)
Monocytes Absolute: 0.4 10*3/uL (ref 0.1–1.0)
NEUTROS ABS: 3.1 10*3/uL (ref 1.7–7.7)
Neutrophils Relative %: 45 % (ref 43–77)
Platelets: 310 10*3/uL (ref 150–400)
RBC: 4.73 MIL/uL (ref 3.87–5.11)
RDW: 12.6 % (ref 11.5–15.5)
WBC: 6.8 10*3/uL (ref 4.0–10.5)

## 2014-11-13 LAB — COMPREHENSIVE METABOLIC PANEL
ALT: 5 U/L — ABNORMAL LOW (ref 6–29)
AST: 13 U/L (ref 10–35)
Albumin: 3.7 g/dL (ref 3.6–5.1)
Alkaline Phosphatase: 86 U/L (ref 33–115)
BUN: 12 mg/dL (ref 7–25)
CALCIUM: 9 mg/dL (ref 8.6–10.2)
CHLORIDE: 102 mmol/L (ref 98–110)
CO2: 22 mmol/L (ref 20–31)
Creat: 0.63 mg/dL (ref 0.50–1.10)
Glucose, Bld: 98 mg/dL (ref 65–99)
Potassium: 4 mmol/L (ref 3.5–5.3)
Sodium: 135 mmol/L (ref 135–146)
Total Bilirubin: 0.5 mg/dL (ref 0.2–1.2)
Total Protein: 7 g/dL (ref 6.1–8.1)

## 2014-11-13 LAB — TSH: TSH: 1.804 u[IU]/mL (ref 0.350–4.500)

## 2014-11-13 NOTE — Telephone Encounter (Signed)
You gave patient name of a Plastic Surgeon pendulous breasts, pt lost the number all she could remember was that her first name was [name]. Please advise

## 2014-11-13 NOTE — Telephone Encounter (Signed)
Plastic Surgeon name is Stephanie Coup

## 2014-11-13 NOTE — Telephone Encounter (Signed)
Rosemarie Ax will relay to patient number give as well

## 2014-11-14 LAB — URINALYSIS W MICROSCOPIC + REFLEX CULTURE
BILIRUBIN URINE: NEGATIVE
Bacteria, UA: NONE SEEN [HPF]
Casts: NONE SEEN [LPF]
Crystals: NONE SEEN [HPF]
GLUCOSE, UA: NEGATIVE
Hgb urine dipstick: NEGATIVE
Ketones, ur: NEGATIVE
LEUKOCYTES UA: NEGATIVE
Nitrite: NEGATIVE
Protein, ur: NEGATIVE
RBC / HPF: NONE SEEN RBC/HPF (ref <=2)
Specific Gravity, Urine: 1.017 (ref 1.001–1.035)
WBC UA: NONE SEEN WBC/HPF (ref <=5)
Yeast: NONE SEEN [HPF]
pH: 5 (ref 5.0–8.0)

## 2014-11-15 ENCOUNTER — Other Ambulatory Visit: Payer: Self-pay | Admitting: Gynecology

## 2014-11-15 DIAGNOSIS — E7800 Pure hypercholesterolemia, unspecified: Secondary | ICD-10-CM

## 2015-02-17 ENCOUNTER — Emergency Department (HOSPITAL_COMMUNITY)
Admission: EM | Admit: 2015-02-17 | Discharge: 2015-02-17 | Disposition: A | Discharge status: Home / Self Care | Payer: Managed Care, Other (non HMO) | Attending: Emergency Medicine | Admitting: Emergency Medicine

## 2015-02-17 ENCOUNTER — Encounter (HOSPITAL_COMMUNITY): Payer: Self-pay | Admitting: Emergency Medicine

## 2015-02-17 DIAGNOSIS — R42 Dizziness and giddiness: Secondary | ICD-10-CM | POA: Insufficient documentation

## 2015-02-17 DIAGNOSIS — Z862 Personal history of diseases of the blood and blood-forming organs and certain disorders involving the immune mechanism: Secondary | ICD-10-CM | POA: Insufficient documentation

## 2015-02-17 LAB — COMPREHENSIVE METABOLIC PANEL
ALK PHOS: 79 U/L (ref 38–126)
ALT: 10 U/L — ABNORMAL LOW (ref 14–54)
AST: 16 U/L (ref 15–41)
Albumin: 3.5 g/dL (ref 3.5–5.0)
Anion gap: 9 (ref 5–15)
BUN: 12 mg/dL (ref 6–20)
CO2: 23 mmol/L (ref 22–32)
Calcium: 8.9 mg/dL (ref 8.9–10.3)
Chloride: 107 mmol/L (ref 101–111)
Creatinine, Ser: 0.63 mg/dL (ref 0.44–1.00)
GFR calc Af Amer: >60 mL/min (ref >60)
GFR calc non Af Amer: >60 mL/min (ref >60)
Glucose, Bld: 144 mg/dL — ABNORMAL HIGH (ref 65–99)
POTASSIUM: 3.8 mmol/L (ref 3.5–5.1)
Sodium: 139 mmol/L (ref 135–145)
TOTAL PROTEIN: 7.2 g/dL (ref 6.5–8.1)
Total Bilirubin: 0.6 mg/dL (ref 0.3–1.2)

## 2015-02-17 LAB — I-STAT CHEM 8, ED
BUN: 13 mg/dL (ref 6–20)
Calcium, Ion: 1.15 mmol/L (ref 1.12–1.23)
Chloride: 105 mmol/L (ref 101–111)
Creatinine, Ser: 0.5 mg/dL (ref 0.44–1.00)
GLUCOSE: 139 mg/dL — AB (ref 65–99)
HCT: 40 % (ref 36.0–46.0)
Hemoglobin: 13.6 g/dL (ref 12.0–15.0)
POTASSIUM: 3.6 mmol/L (ref 3.5–5.1)
Sodium: 140 mmol/L (ref 135–145)
TCO2: 19 mmol/L (ref 0–100)

## 2015-02-17 LAB — I-STAT TROPONIN, ED: Troponin i, poc: 0 ng/mL (ref 0.00–0.08)

## 2015-02-17 LAB — CBC
HCT: 37 % (ref 36.0–46.0)
Hemoglobin: 12.4 g/dL (ref 12.0–15.0)
MCH: 26.4 pg (ref 26.0–34.0)
MCHC: 33.5 g/dL (ref 30.0–36.0)
MCV: 78.9 fL (ref 78.0–100.0)
Platelets: 300 10*3/uL (ref 150–400)
RBC: 4.69 MIL/uL (ref 3.87–5.11)
RDW: 12.6 % (ref 11.5–15.5)
WBC: 6.3 10*3/uL (ref 4.0–10.5)

## 2015-02-17 LAB — CBG MONITORING, ED: Glucose-Capillary: 144 mg/dL — ABNORMAL HIGH (ref 65–99)

## 2015-02-17 LAB — DIFFERENTIAL
Basophils Absolute: 0 10*3/uL (ref 0.0–0.1)
Basophils Relative: 1 %
EOS ABS: 0.3 10*3/uL (ref 0.0–0.7)
Eosinophils Relative: 5 %
Lymphocytes Relative: 30 %
Lymphs Abs: 1.9 10*3/uL (ref 0.7–4.0)
Monocytes Absolute: 0.3 10*3/uL (ref 0.1–1.0)
Monocytes Relative: 5 %
Neutro Abs: 3.7 10*3/uL (ref 1.7–7.7)
Neutrophils Relative %: 59 %

## 2015-02-17 MED ORDER — MECLIZINE HCL 25 MG PO TABS: 25.0000 mg | ORAL_TABLET | Freq: Three times a day (TID) | ORAL | Status: DC | PRN

## 2015-02-17 NOTE — Discharge Instructions (Signed)
Take meclizine as needed,stay well hydrated with water.  If you were given medicines take as directed.  If you are on coumadin or contraceptives realize their levels and effectiveness is altered by many different medicines.  If you have any reaction (rash, tongues swelling, other) to the medicines stop taking and see a physician.    If your blood pressure was elevated in the ER make sure you follow up for management with a primary doctor or return for chest pain, shortness of breath or stroke symptoms.  Please follow up as directed and return to the ER or see a physician for new or worsening symptoms.  Thank you. Filed Vitals:   02/17/15 1404 02/17/15 1557  BP: 122/74 120/71  Pulse: 79 74  Temp: 98.1 F (36.7 C)   TempSrc: Oral   Resp: 18 18  Height: 4\' 11"  (1.499 m)   Weight: 148 lb 5 oz (67.274 kg)   SpO2: 98% 96%

## 2015-02-17 NOTE — ED Provider Notes (Signed)
CSN: [id number]     Arrival date & time 02/17/15  1321 History   First MD Initiated Contact with Patient 02/17/15 1525     Chief Complaint  Patient presents with  . Dizziness     (Consider location/radiation/quality/duration/timing/severity/associated sxs/prior Treatment) HPI Comments: 50 year old female with history of dizziness elevated blood pressure presents for recurrent dizziness. Patient's had or 5 episodes total. Patient one episode today more sudden onset almost completely resolved very minimal currently. Patient still able to ambulate No new headaches or neck pain. No neurologic symptoms such as weakness or numbness. No history of stroke. Worse with movement and walking  Patient is a 50 y.o. female presenting with dizziness. The history is provided by the patient.  Dizziness Associated symptoms: no chest pain, no headaches, no shortness of breath and no vomiting     Past Medical History  Diagnosis Date  . Elevated BP 03/07/2013  . Anemia 12/27/2012   Past Surgical History  Procedure Laterality Date  . Cesarean section      X2  . Lithotripsy    . Tubal ligation    . Cholecystectomy  05-05-2013   Family History  Problem Relation Age of Onset  . Cancer Neg Hx   . CAD Neg Hx   . Diabetes Father    Social History  Substance Use Topics  . Smoking status: Never Smoker   . Smokeless tobacco: Never Used  . Alcohol Use: No     Comment: rare   OB History    Gravida Para Term Preterm AB TAB SAB Ectopic Multiple Living   5 3   2 2    3      Review of Systems  Constitutional: Negative for fever and chills.  HENT: Negative for congestion.   Eyes: Negative for visual disturbance.  Respiratory: Negative for shortness of breath.   Cardiovascular: Negative for chest pain.  Gastrointestinal: Negative for vomiting and abdominal pain.  Genitourinary: Negative for dysuria and flank pain.  Musculoskeletal: Negative for back pain, neck pain and neck stiffness.  Skin: Negative  for rash.  Neurological: Positive for dizziness. Negative for light-headedness and headaches.      Allergies  Review of patient's allergies indicates no known allergies.  Home Medications   Prior to Admission medications   Medication Sig Start Date End Date Taking? Authorizing Provider  ibuprofen (ADVIL,MOTRIN) 600 MG tablet Take 1 tablet (600 mg total) by mouth every 8 (eight) hours as needed. Patient taking differently: Take 600 mg by mouth every 8 (eight) hours as needed for moderate pain.  02/13/14  Yes Lance Bosch, NP  meclizine (ANTIVERT) 25 MG tablet Take 1 tablet (25 mg total) by mouth 3 (three) times daily as needed for dizziness. 02/17/15   Elnora Morrison, MD  meloxicam (MOBIC) 7.5 MG tablet Take 1 tablet (7.5 mg total) by mouth daily. Patient not taking: Reported on 11/09/2014 05/19/14   Lance Bosch, NP  polyethylene glycol powder (GLYCOLAX/MIRALAX) powder Take 17 g by mouth daily. Patient not taking: Reported on 11/09/2014 09/04/14   Lance Bosch, NP  tolterodine (DETROL LA) 2 MG 24 hr capsule Take 1 capsule (2 mg total) by mouth daily. Patient not taking: Reported on 02/17/2015 11/10/14   Terrance Mass, MD   BP 120/71 mmHg  Pulse 74  Temp(Src) 98.1 F (36.7 C) (Oral)  Resp 18  Ht 4\' 11"  (1.499 m)  Wt 148 lb 5 oz (67.274 kg)  BMI 29.94 kg/m2  SpO2 96%  LMP 01/31/2015 (Approximate)  Physical Exam  Constitutional: She is oriented to person, place, and time. She appears well-developed and well-nourished.  HENT:  Head: Normocephalic and atraumatic.  Eyes: Conjunctivae are normal. Right eye exhibits no discharge. Left eye exhibits no discharge.  Neck: Normal range of motion. Neck supple. No tracheal deviation present.  Cardiovascular: Normal rate and regular rhythm.   Pulmonary/Chest: Effort normal and breath sounds normal.  Abdominal: Soft. She exhibits no distension. There is no tenderness. There is no guarding.  Musculoskeletal: She exhibits no edema.   Neurological: She is alert and oriented to person, place, and time. Coordination and gait normal. GCS eye subscore is 4. GCS verbal subscore is 5. GCS motor subscore is 6.  5+ strength in UE and LE with f/e at major joints. Sensation to palpation intact in UE and LE. CNs 2-12 grossly intact.  EOMFI.  PERRL.   Finger nose and coordination intact bilateral.   Visual fields intact to finger testing. No nystagmus   Skin: Skin is warm. No rash noted.  Psychiatric: She has a normal mood and affect.  Nursing note and vitals reviewed.   ED Course  Procedures (including critical care time) Labs Review Labs Reviewed  COMPREHENSIVE METABOLIC PANEL - Abnormal; Notable for the following:    Glucose, Bld 144 (*)    ALT 10 (*)    All other components within normal limits  CBG MONITORING, ED - Abnormal; Notable for the following:    Glucose-Capillary 144 (*)    All other components within normal limits  I-STAT CHEM 8, ED - Abnormal; Notable for the following:    Glucose, Bld 139 (*)    All other components within normal limits  CBC  DIFFERENTIAL  I-STAT TROPOININ, ED    Imaging Review No results found. I have personally reviewed and evaluated these images and lab results as part of my medical decision-making.   EKG Interpretation   Date/Time:  Saturday February 17 2015 14:11:42 EST Ventricular Rate:  70 PR Interval:  162 QRS Duration: 78 QT Interval:  378 QTC Calculation: 408 R Axis:   52 Text Interpretation:  Normal sinus rhythm Normal ECG Confirmed by Sakiyah Shur   MD, Nicola Heinemann ([id number]) on 02/17/2015 4:15:53 PM      MDM   Final diagnoses:  Dizziness   Patient presents with dizziness normal neuro exam, screening blood work unremarkable, vitals normal. No signs of any emergent pathology such as stroke at this time. Discussed close outpatient follow-up trial of meclizine.  Results and differential diagnosis were discussed with the patient/parent/guardian. Xrays were independently  reviewed by myself.  Close follow up outpatient was discussed, comfortable with the plan.   Medications - No data to display  Filed Vitals:   02/17/15 1404 02/17/15 1557  BP: 122/74 120/71  Pulse: 79 74  Temp: 98.1 F (36.7 C)   TempSrc: Oral   Resp: 18 18  Height: 4\' 11"  (1.499 m)   Weight: 148 lb 5 oz (67.274 kg)   SpO2: 98% 96%    Final diagnoses:  Dizziness        Elnora Morrison, MD 02/17/15 1627

## 2015-02-17 NOTE — ED Notes (Signed)
Pt c/o sudden onset of dizziness 2 hours PTA. Pt reports that she was not doing anything when dizziness began. Speech clear, no arm drift, equal grips, no facial droop.

## 2015-04-12 ENCOUNTER — Encounter: Payer: Self-pay | Admitting: Women's Health

## 2015-04-12 ENCOUNTER — Ambulatory Visit (INDEPENDENT_AMBULATORY_CARE_PROVIDER_SITE_OTHER): Payer: Managed Care, Other (non HMO) | Admitting: Women's Health

## 2015-04-12 VITALS — BP 115/82 | Ht 60.0 in | Wt 143.0 lb

## 2015-04-12 DIAGNOSIS — B3731 Acute candidiasis of vulva and vagina: Secondary | ICD-10-CM

## 2015-04-12 DIAGNOSIS — B373 Candidiasis of vulva and vagina: Secondary | ICD-10-CM | POA: Diagnosis not present

## 2015-04-12 LAB — WET PREP FOR TRICH, YEAST, CLUE
CLUE CELLS WET PREP: NONE SEEN
TRICH WET PREP: NONE SEEN

## 2015-04-12 MED ORDER — FLUCONAZOLE 150 MG PO TABS: 150.0000 mg | ORAL_TABLET | Freq: Once | ORAL | Status: DC

## 2015-04-12 MED ORDER — NYSTATIN-TRIAMCINOLONE 100000-0.1 UNIT/GM-% EX OINT: 1.0000 "application " | TOPICAL_OINTMENT | Freq: Two times a day (BID) | CUTANEOUS | Status: DC

## 2015-04-12 NOTE — Patient Instructions (Signed)

## 2015-04-12 NOTE — Progress Notes (Signed)
Patient ID: Colleen Bailey, female   DOB: [birth year], 50 y.o.   MRN: [id number] Presents with complaint of increased itching and irritation in the groin with minimal relief with over-the-counter creams for the past several weeks. Denies vaginal discharge, urinary symptoms, abdominal pain or fever. Mostly monthly cycle/BTL. Same partner.  Exam: Appears well, external genitalia erythema at both right and left groin, external genitalia within normal limits, speculum exam scant white discharge no odor noted, mild erythema. Wet prep positive for yeast. Bimanual no CMT or adnexal tenderness.  Yeast vaginitis  Plan: Mycolog ointment to groin twice daily, loose clothing, open to air as able. Diflucan 150 by mouth 1 dose prescription, proper use given and reviewed. Instructed to call if continued problems.

## 2015-06-26 ENCOUNTER — Ambulatory Visit (INDEPENDENT_AMBULATORY_CARE_PROVIDER_SITE_OTHER): Payer: Managed Care, Other (non HMO) | Admitting: Women's Health

## 2015-06-26 ENCOUNTER — Encounter: Payer: Self-pay | Admitting: Women's Health

## 2015-06-26 VITALS — BP 122/80 | Ht 60.0 in | Wt 150.0 lb

## 2015-06-26 DIAGNOSIS — R21 Rash and other nonspecific skin eruption: Secondary | ICD-10-CM | POA: Diagnosis not present

## 2015-06-26 MED ORDER — BETAMETHASONE VALERATE 0.1 % EX CREA: TOPICAL_CREAM | Freq: Two times a day (BID) | CUTANEOUS | Status: DC

## 2015-06-26 NOTE — Progress Notes (Signed)
Patient ID: Colleen Bailey, female   DOB: 1965-12-18, 50 y.o.   MRN: [id number] Presents with complaint of persistent irritation with itching on right inner thigh. Had used Mycolog about 1 month ago with some relief , had similar area on left inner thigh that has completely resolved. Denies vaginal discharge or vaginal itching or odor. Denies abdominal pain or fever. Not sexually active in years. Mostly monthly cycle/BTL.  Exam: Appears well. Abdomen soft without tenderness. External genitalia within normal limits, speculum exam scant discharge no erythema noted. On right inner thigh  2 cm  erythematous patch noted without exudate.  Right inner thigh rash  Plan: Options reviewed will try Valisone 0.1% twice daily, loose clothing, open to air as best able, change clothes after exercise and if becomes overheated. Try to avoid scratching area. Instructed to call if no relief.

## 2015-07-09 ENCOUNTER — Ambulatory Visit: Payer: Managed Care, Other (non HMO) | Admitting: Internal Medicine

## 2015-08-02 ENCOUNTER — Encounter: Payer: Self-pay | Admitting: Sports Medicine

## 2015-08-02 ENCOUNTER — Ambulatory Visit (INDEPENDENT_AMBULATORY_CARE_PROVIDER_SITE_OTHER): Payer: Managed Care, Other (non HMO) | Admitting: Sports Medicine

## 2015-08-02 VITALS — BP 134/73 | Ht 60.0 in | Wt 143.0 lb

## 2015-08-02 DIAGNOSIS — S83282A Other tear of lateral meniscus, current injury, left knee, initial encounter: Secondary | ICD-10-CM

## 2015-08-02 NOTE — Progress Notes (Signed)
Patient ID: Colleen Bailey, female   DOB: Jan 11, 1966, 50 y.o.   MRN: [id number]  Colleen Bailey - 50 y.o. female MRN [id number]  Date of birth: 01/13/1966  SUBJECTIVE:  Including CC & ROS.  CC: Left lateral knee pain Patient presents with left lateral knee pain of 2-3 weeks duration. She does not recall any specific injury to her knee. She has recently started increasing her walking in order to lose weight. She has lost 7 pounds. She does not feel as though she is able to walk without pain. It is worse when she is walking a lot and worse with flexion of the knee. Denies any catching or locking. Denies any swelling of the knee joint. She had x-rays of the knee last year which did not showed degenerative disease. An MRI was ordered but patient did not get this done. She does report that she has pain with palpation. It bothers her the most when she is trying to sleep.  She has not been taking anything for the pain.  ROS: No unexpected weight loss, fever, chills, swelling, instability, muscle pain, numbness/tingling, redness, otherwise see HPI    HISTORY: Past Medical, Surgical, Social, and Family History Reviewed & Updated per EMR.   Pertinent Historical Findings include: PMSHx -  anemia PSHx -  noncontributory FHx -  noncontributory Medications -none  DATA REVIEWED: X-rays 4 view of the left knee from July 2016 did not reveal view arthritic changes in any compartment.  PHYSICAL EXAM:  VS: BP:134/73 mmHg  HR: bpm  TEMP: ( )  RESP:   HT:5' (152.4 cm)   WT:143 lb (64.864 kg)  BMI:28 PHYSICAL EXAM: Gen: NAD, alert, cooperative with exam, well-appearing HEENT: clear conjunctiva,  CV:  no edema, capillary refill brisk, normal rate Resp: non-labored Skin: no rashes, normal turgor  Neuro: no gross deficits.  Psych:  alert and oriented  Knee: Normal to inspection with no erythema or effusion or obvious bony abnormalities. Palpation normal with no warmth,patellar tenderness, or condyle  tenderness. Positive joint line tenderness on the left ROM full in flexion and extension and lower leg rotation, but all planes do produce pain. Ligaments with solid consistent endpoints including ACL, PCL, LCL, MCL. Negative Mcmurray's. Non painful patellar compression. Patellar glide without crepitus. Patellar and quadriceps tendons unremarkable. Hamstring and quadriceps strength is normal.   Ultrasound of left knee, limited: Patient was placed supine with the knee in 30 flexion.  Quadriceps tendon appeared intact with no hypoechoic changes. Suprapatellar pouch with minimal effusion. Lateral joint line with split meniscus and mild surrounding edema. LCL appeared intact with no hypoechoic changes. Medial meniscus without any edema or tearing seen. MCL appeared intact with no hypoechoic changes. Ultrasound findings consistent with lateral meniscus tear.   ASSESSMENT & PLAN:   Tear of lateral meniscus of left knee Tear of lateral meniscus seen on ultrasound. Exercises demonstrated in the office and handout given.  Decompression sleeve offered but patient declined due to cost. Recommend over-the-counter ibuprofen to help relieve pain. Discussed doing exercises to strengthen muscles before doing a lot of walking.

## 2015-08-02 NOTE — Assessment & Plan Note (Addendum)
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 Exercises demonstrated in the office and handout given.  Decompression sleeve offered but patient declined due to cost. Recommend over-the-counter ibuprofen to help relieve pain. Discussed doing exercises to strengthen muscles before doing a lot of walking.  This is small and can heal with conservative care if she will do rehab exercises.  PT is an option if not improving.

## 2015-08-14 ENCOUNTER — Ambulatory Visit (INDEPENDENT_AMBULATORY_CARE_PROVIDER_SITE_OTHER): Payer: Managed Care, Other (non HMO) | Admitting: Sports Medicine

## 2015-08-14 ENCOUNTER — Encounter: Payer: Self-pay | Admitting: Sports Medicine

## 2015-08-14 VITALS — BP 128/83 | HR 72 | Ht 60.0 in | Wt 143.0 lb

## 2015-08-14 DIAGNOSIS — S83282D Other tear of lateral meniscus, current injury, left knee, subsequent encounter: Secondary | ICD-10-CM | POA: Diagnosis not present

## 2015-08-14 MED ORDER — METHYLPREDNISOLONE ACETATE 40 MG/ML IJ SUSP
40.0000 mg | Freq: Once | INTRAMUSCULAR | Status: AC
  Administered 2015-08-14: 40 mg via INTRA_ARTICULAR

## 2015-08-14 NOTE — Progress Notes (Signed)
   Subjective:    Patient ID: Colleen Bailey, female    DOB: 1965-12-27, 50 y.o.   MRN: [id number]  HPI   Patient comes in today with persistent left knee pain. Patient was last seen by Dr. Oneida Alar on July 20. An ultrasound evaluation at that time revealed a split tear through the lateral meniscus. She was given a home exercise program and offered a compression sleeve which she declined. Since that time her pain has grown worse. Pain is primarily along the lateral knee. She is getting swelling. She is frustrated with her pain and inability to exercise. Denies any significant problems with this knee in the past. No prior knee surgeries.    Review of Systems     Objective:   Physical Exam  Well-developed, well-nourished. No acute distress  Left knee: Full range of motion. Trace effusion. She is tender to palpation along the lateral joint line with pain but no popping with McMurray's. Good joint stability. Neurovascularly intact distally. Walking with a slight limp.  Recent x-rays show a paucity of degenerative change and nothing acute [facility] ultrasound findings of the left knee from July 20 are as above      Assessment & Plan:   Persistent left knee pain secondary to ultrasound diagnosed lateral meniscal tear  Patient's left knee is injected with cortisone today. An anterior lateral approach was utilized. She tolerates this without difficulty. I'm going to send her for some formal physical therapy and have her follow-up with me in 3 weeks. Although she does not have a compression sleeve, she does have an off-the-shelf brace which she has purchased. If her symptoms persist at follow-up then we may need to consider merits of further diagnostic imaging in anticipation of possible surgical referral.  Consent obtained and verified. Time-out conducted. Noted no overlying erythema, induration, or other signs of local infection. Skin prepped in a sterile fashion. Topical analgesic spray: Ethyl  chloride. Joint: left knee Needle: 22g 1.5 inch Completed without difficulty. Meds: 3cc 1% xylocaine, 1cc (40mg ) depomedrol  Advised to call if fevers/chills, erythema, induration, drainage, or persistent bleeding.

## 2015-08-20 ENCOUNTER — Encounter: Payer: Self-pay | Admitting: Physical Therapy

## 2015-08-20 ENCOUNTER — Ambulatory Visit: Payer: Managed Care, Other (non HMO) | Attending: Sports Medicine | Admitting: Physical Therapy

## 2015-08-20 DIAGNOSIS — R262 Difficulty in walking, not elsewhere classified: Secondary | ICD-10-CM | POA: Diagnosis present

## 2015-08-20 DIAGNOSIS — M6281 Muscle weakness (generalized): Secondary | ICD-10-CM | POA: Diagnosis present

## 2015-08-20 DIAGNOSIS — M25562 Pain in left knee: Secondary | ICD-10-CM | POA: Diagnosis present

## 2015-08-20 NOTE — Patient Instructions (Signed)
Access Code: [email or web address]  URL: [email or web address]  Date: 08/20/2015  Prepared by: Selinda Eon   Exercises  Seated Long Arc Quad - 20 reps - 1 sets - 5 hold - 1x daily - 7x weekly  Seated Hamstring Stretch with Strap - 3 reps - 1 sets - 30 hold - 3x daily - 7x weekly  Wall Quarter Squat - 5 reps - 1 sets - 30 hold - 1x daily - 7x weekly  Standing Heel Raise - 20 reps - 1 sets - 3 hold - 1x daily - 7x weekly

## 2015-08-20 NOTE — Therapy (Addendum)
[place], [place], [address] Phone: [phone]   Fax:  [phone]  Physical Therapy Evaluation/Discharge Summary  Patient Details  Name: Colleen Bailey MRN: [id number] Date of Birth: [birth year] Referring Provider: Lilia Argue, DO  Encounter Date: 08/20/2015      PT End of Session - 08/20/15 0922    Visit Number 1   Number of Visits 9   Date for PT Re-Evaluation 09/21/15   Authorization Type [employer]   PT Start Time 0845   PT Stop Time 0931   PT Time Calculation (min) 46 min   Activity Tolerance Patient tolerated treatment well   Behavior During Therapy [facility] for tasks assessed/performed      Past Medical History:  Diagnosis Date  . Anemia 12/27/2012  . Elevated BP 03/07/2013    Past Surgical History:  Procedure Laterality Date  . CESAREAN SECTION     X2  . CHOLECYSTECTOMY  05-05-2013  . LITHOTRIPSY    . TUBAL LIGATION      There were no vitals filed for this visit.       Subjective Assessment - 08/20/15 0848    Subjective "It just hurts" A couple weeks ago, indisious onset of pain. Denies previous kneee pain. Works Psychologist, sport and exercise, packing, etc; is standing most of the day. Injection last week seems to have helped.  Could not even walk prior to injection   Limitations Standing;Walking   How long can you stand comfortably? 7-8 hr   How long can you walk comfortably? joint line pain always   Patient Stated Goals pain when moving at night   Currently in Pain? Yes   Pain Score 4   not hurting that much since injection   Pain Location Knee   Pain Orientation Left   Pain Type Acute pain   Pain Onset 1 to 4 weeks ago   Pain Frequency Constant   Aggravating Factors  standing, walking, moving at night   Pain Relieving Factors injection, ice            [facility] PT Assessment - 08/20/15 0001      Assessment   Medical Diagnosis s/p lateral meniscus tear   Referring Provider Lilia Argue,  DO   Onset Date/Surgical Date --  a couple weeks ago   Hand Dominance Right   Next MD Visit --  3 weeks   Prior Therapy none     Precautions   Precautions None     Restrictions   Weight Bearing Restrictions No     Balance Screen   Has the patient fallen in the past 6 months No     [place] residence   Living Arrangements Children   Type of Home Apartment   Home Access Level entry     Prior Function   Level of Independence Independent     Cognition   Overall Cognitive Status Within Functional Limits for tasks assessed     Observation/Other Assessments   Focus on Therapeutic Outcomes (FOTO)  41% ability     Sensation   Additional Comments WFL     ROM / Strength   AROM / PROM / Strength Strength     Strength   Strength Assessment Site Knee;Hip   Right/Left Hip Left   Left Hip Flexion 3+/5  R 4/5   Left Hip Extension 4-/5  R 4-/5   Left Hip ABduction 3+/5  R 3+/5   Right/Left Knee Left   Left Knee  Flexion 5/5  minimal pain   Left Knee Extension 5/5  minimal pain     Palpation   Patella mobility WFL   Palpation comment denies TTP                   [facility] Adult PT Treatment/Exercise - 08/20/15 0001      Exercises   Exercises Knee/Hip     Knee/Hip Exercises: Stretches   Passive Hamstring Stretch Limitations seated with towel 3x30s     Knee/Hip Exercises: Standing   Heel Raises 15 reps   Wall Squat 5 reps  30s holds     Knee/Hip Exercises: Seated   Long Arc Quad 15 reps  5s holds     Modalities   Modalities Cryotherapy     Cryotherapy   Number Minutes Cryotherapy 10 Minutes  some concurrent with education   Cryotherapy Location Knee  Left   Type of Cryotherapy Ice pack                PT Education - 08/20/15 0930    Education provided Yes   Education Details anatomy of condition, POC, HEP, shoe wear, RICE   Person(s) Educated Patient   Methods Explanation;Demonstration;Tactile  cues;Verbal cues;Handout   Comprehension Verbalized understanding;Returned demonstration;Verbal cues required;Tactile cues required;Need further instruction             PT Long Term Goals - 08/20/15 0927      PT LONG TERM GOAL #1   Title FOTO to 65% ability to indicate significant functional improvement by 9/8   Baseline 41% at eval   Time 4   Period Weeks   Status New     PT LONG TERM GOAL #2   Title Pt will be able to get into/out of car without difficulty   Baseline "moderate difficulty" at eval   Time 4   Period Weeks   Status New     PT LONG TERM GOAL #3   Title <=3/10 knee pain at the end of a day at work   Baseline 5/10 at eval   Time 4   Period Weeks   Status New     PT LONG TERM GOAL #4   Title Pt will be independent with HEP and verbalize appropriate RICE technique for control of swelling and pain   Baseline began establishing at eval   Time 4   Period Weeks   Status New               Plan - 08/20/15 8127    Clinical Impression Statement Pt presents to PT with complaints of L knee pain along medial and lateral joint line. Was diagnosed with lateral meniscus tear and recieved injection last week that reportedly decreased pain significantly. 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 Pt was educated on importance of supportive shoe wear and addressing weakness in hips to provide support to knees. Pt will benefit from skilled PT in order to improve LE biomechanical strength to improve support provided to knee joint.    Rehab Potential Fair   Clinical Impairments Affecting Rehab Potential meniscus tear paired with job requiring long hours of standing    PT Frequency 2x / week   PT Duration 4 weeks   PT Treatment/Interventions ADLs/Self Care Home Management;Cryotherapy;Electrical Stimulation;Iontophoresis 77m/ml Dexamethasone;Stair training;Gait training;Ultrasound;Moist Heat;Therapeutic activities;Therapeutic exercise;Neuromuscular  re-education;Patient/family education;Manual techniques;Dry needling;Taping   PT Next Visit Plan nu step, hip/LE strengthening   PT Home Exercise Plan heel raises, LAQ, HS stretch seated,  wall sits   Consulted and Agree with Plan of Care Patient      Patient will benefit from skilled therapeutic intervention in order to improve the following deficits and impairments:  Difficulty walking, Decreased activity tolerance, Improper body mechanics, Decreased strength  Visit Diagnosis: Pain in left knee - Plan: PT plan of care cert/re-cert  Muscle weakness (generalized) - Plan: PT plan of care cert/re-cert  Difficulty in walking, not elsewhere classified - Plan: PT plan of care cert/re-cert     Problem List Patient Active Problem List   Diagnosis Date Noted  . Tear of lateral meniscus of left knee 08/02/2015  . Encounter for routine gynecological examination 11/09/2014  . Painful patella 06/09/2014  . Combined abdominal and pelvic pain 11/11/2013  . Intramural leiomyoma of uterus 11/11/2013  . Bilateral shoulder pain 08/25/2013  . Pain in joint, shoulder region 05/26/2013  . Chronic cholecystitis with calculus 03/18/2013  . Elevated BP 03/07/2013  . Dizziness and giddiness 03/07/2013  . Annual physical exam 12/27/2012  . Anemia 12/27/2012  . Menorrhagia 08/30/2012   Necie Wilcoxson C. Ercia Crisafulli PT, DPT 08/20/15 9:35 AM   [facility] [address] [place], [place], [address] Phone: [phone]   Fax:  [phone]  Name: Colleen Bailey MRN: [id number] Date of Birth: 03-21-1965  PHYSICAL THERAPY DISCHARGE SUMMARY  Visits from Start of Care: 1  Current functional level related to goals / functional outcomes: See above   Remaining deficits: See above   Education / Equipment: Anatomy of condition, POC, HEP, exercise form/rationale  Plan: Patient agrees to discharge.  Patient goals were not met. Patient is being discharged due  to not returning since the last visit.  ?????    Vonte Rossin C. Thuy Atilano PT, DPT 10/16/15 4:09 PM

## 2015-09-03 ENCOUNTER — Ambulatory Visit: Payer: Managed Care, Other (non HMO) | Admitting: Sports Medicine

## 2015-09-20 ENCOUNTER — Telehealth: Payer: Self-pay

## 2015-09-20 ENCOUNTER — Other Ambulatory Visit: Payer: Self-pay | Admitting: Women's Health

## 2015-09-20 DIAGNOSIS — R21 Rash and other nonspecific skin eruption: Secondary | ICD-10-CM

## 2015-09-20 MED ORDER — BETAMETHASONE VALERATE 0.1 % EX OINT: 1.0000 "application " | TOPICAL_OINTMENT | Freq: Two times a day (BID) | CUTANEOUS | 1 refills | Status: DC

## 2015-09-20 NOTE — Telephone Encounter (Signed)
Telephone call, states Valisone helped and initial thigh rash resolved, but now on other inner thigh, is exercising and has lost 10 pounds. Reviewed importance of changing and dry clothes, avoid scratching area, will try Valisone 0.1% ointment twice daily, dermatologist if no relief.

## 2015-09-20 NOTE — Telephone Encounter (Signed)
Patient saw you back in June for a rash on thigh. She said you told her to call you if rash persisted and you would send a different cream. She said it is on her other thigh and will not go away.

## 2015-10-01 ENCOUNTER — Other Ambulatory Visit: Payer: Self-pay | Admitting: Physician Assistant

## 2015-10-01 ENCOUNTER — Ambulatory Visit: Payer: Managed Care, Other (non HMO) | Attending: Internal Medicine | Admitting: Physician Assistant

## 2015-10-01 ENCOUNTER — Encounter: Payer: Self-pay | Admitting: Physician Assistant

## 2015-10-01 VITALS — BP 144/81 | HR 79 | Temp 98.2°F | Resp 18 | Ht 58.5 in | Wt 140.8 lb

## 2015-10-01 DIAGNOSIS — M545 Low back pain, unspecified: Secondary | ICD-10-CM

## 2015-10-01 DIAGNOSIS — R399 Unspecified symptoms and signs involving the genitourinary system: Secondary | ICD-10-CM | POA: Diagnosis not present

## 2015-10-01 DIAGNOSIS — IMO0001 Reserved for inherently not codable concepts without codable children: Secondary | ICD-10-CM

## 2015-10-01 DIAGNOSIS — R030 Elevated blood-pressure reading, without diagnosis of hypertension: Secondary | ICD-10-CM | POA: Diagnosis not present

## 2015-10-01 LAB — POCT URINALYSIS DIPSTICK
Bilirubin, UA: NEGATIVE
Glucose, UA: NEGATIVE
KETONES UA: NEGATIVE
LEUKOCYTES UA: NEGATIVE
Nitrite, UA: NEGATIVE
PH UA: 5.5
PROTEIN UA: NEGATIVE
SPEC GRAV UA: 1.015
UROBILINOGEN UA: 0.2

## 2015-10-01 MED ORDER — IBUPROFEN 600 MG PO TABS: 600.0000 mg | ORAL_TABLET | Freq: Three times a day (TID) | ORAL | 0 refills | Status: DC | PRN

## 2015-10-01 NOTE — Patient Instructions (Addendum)
Check BP 2-3 times/week and record.  Bring to f/up appt  Hypertension Hypertension, commonly called high blood pressure, is when the force of blood pumping through your arteries is too strong. Your arteries are the blood vessels that carry blood from your heart throughout your body. A blood pressure reading consists of a higher number over a lower number, such as 110/72. The higher number (systolic) is the pressure inside your arteries when your heart pumps. The lower number (diastolic) is the pressure inside your arteries when your heart relaxes. Ideally you want your blood pressure below 120/80. Hypertension forces your heart to work harder to pump blood. Your arteries may become narrow or stiff. Having untreated or uncontrolled hypertension can cause heart attack, stroke, kidney disease, and other problems. RISK FACTORS Some risk factors for high blood pressure are controllable. Others are not.  Risk factors you cannot control include:   Race. You may be at higher risk if you are African American.  Age. Risk increases with age.  Gender. Men are at higher risk than women before age 1 years. After age 42, women are at higher risk than men. Risk factors you can control include:  Not getting enough exercise or physical activity.  Being overweight.  Getting too much fat, sugar, calories, or salt in your diet.  Drinking too much alcohol. SIGNS AND SYMPTOMS Hypertension does not usually cause signs or symptoms. Extremely high blood pressure (hypertensive crisis) may cause headache, anxiety, shortness of breath, and nosebleed. DIAGNOSIS To check if you have hypertension, your health care provider will measure your blood pressure while you are seated, with your arm held at the level of your heart. It should be measured at least twice using the same arm. Certain conditions can cause a difference in blood pressure between your right and left arms. A blood pressure reading that is higher than  normal on one occasion does not mean that you need treatment. If it is not clear whether you have high blood pressure, you may be asked to return on a different day to have your blood pressure checked again. Or, you may be asked to monitor your blood pressure at home for 1 or more weeks. TREATMENT Treating high blood pressure includes making lifestyle changes and possibly taking medicine. Living a healthy lifestyle can help lower high blood pressure. You may need to change some of your habits. Lifestyle changes may include:  Following the DASH diet. This diet is high in fruits, vegetables, and whole grains. It is low in salt, red meat, and added sugars.  Keep your sodium intake below 2,300 mg per day.  Getting at least 30-45 minutes of aerobic exercise at least 4 times per week.  Losing weight if necessary.  Not smoking.  Limiting alcoholic beverages.  Learning ways to reduce stress. Your health care provider may prescribe medicine if lifestyle changes are not enough to get your blood pressure under control, and if one of the following is true:  You are 36-61 years of age and your systolic blood pressure is above 140.  You are 26 years of age or older, and your systolic blood pressure is above 150.  Your diastolic blood pressure is above 90.  You have diabetes, and your systolic blood pressure is over [id number] or your diastolic blood pressure is over 90.  You have kidney disease and your blood pressure is above 140/90.  You have heart disease and your blood pressure is above 140/90. Your personal target blood pressure may vary depending  on your medical conditions, your age, and other factors. HOME CARE INSTRUCTIONS  Have your blood pressure rechecked as directed by your health care provider.   Take medicines only as directed by your health care provider. Follow the directions carefully. Blood pressure medicines must be taken as prescribed. The medicine does not work as well when you  skip doses. Skipping doses also puts you at risk for problems.  Do not smoke.   Monitor your blood pressure at home as directed by your health care provider. SEEK MEDICAL CARE IF:   You think you are having a reaction to medicines taken.  You have recurrent headaches or feel dizzy.  You have swelling in your ankles.  You have trouble with your vision. SEEK IMMEDIATE MEDICAL CARE IF:  You develop a severe headache or confusion.  You have unusual weakness, numbness, or feel faint.  You have severe chest or abdominal pain.  You vomit repeatedly.  You have trouble breathing. MAKE SURE YOU:   Understand these instructions.  Will watch your condition.  Will get help right away if you are not doing well or get worse.   This information is not intended to replace advice given to you by your health care provider. Make sure you discuss any questions you have with your health care provider.   Document Released: 12/30/2004 Document Revised: 05/16/2014 Document Reviewed: 10/22/2012 Elsevier Interactive Patient Education [employer].

## 2015-10-01 NOTE — Progress Notes (Signed)
Colleen Bailey, is a 50 y.o. female  [id number]  [id number]  DOB - 01/24/1965  Subjective:  Chief Complaint and HPI: Colleen Bailey is a 50 y.o. female here for 3 day history of slight urinary frequency and pain in the mid-lower back.  No pain in flank.  She is due for her period.  NKI.  She denies N/V/D.  No f/c.  No vaginal discharge.    Denies s/sx with elevated BP.  Hasn't checked her BP OOO.    ROS:   Constitutional:  No f/c, No night sweats, No unexplained weight loss. EENT:  No vision changes, No blurry vision, No hearing changes. No mouth, throat, or ear problems.  Respiratory: No cough, No SOB Cardiac: No CP, no palpitations GI:  No abd pain, No N/V/D. GU: +Urinary s/sx Musculoskeletal: pain in middle low back Neuro: No headache, no dizziness, no motor weakness.  Skin: No rash Endocrine:  No polydipsia. No polyuria.  Psych: Denies SI/HI  No problems updated.  ALLERGIES: No Known Allergies  PAST MEDICAL HISTORY: Past Medical History:  Diagnosis Date  . Anemia 12/27/2012  . Elevated BP 03/07/2013    MEDICATIONS AT HOME: Prior to Admission medications   Medication Sig Start Date End Date Taking? Authorizing Provider  betamethasone valerate ointment (VALISONE) 0.1 % Apply 1 application topically 2 (two) times daily. 09/20/15  Yes Huel Cote, NP  ibuprofen (ADVIL,MOTRIN) 600 MG tablet Take 1 tablet (600 mg total) by mouth every 8 (eight) hours as needed. 10/01/15   Argentina Donovan, PA-C     Objective:  EXAM:   Vitals:   10/01/15 1449  BP: (!) 144/81  Pulse: 79  Resp: 18  Temp: 98.2 F (36.8 C)  TempSrc: Oral  SpO2: 97%  Weight: 140 lb 12.8 oz (63.9 kg)  Height: 4' 10.5" (1.486 m)    General appearance : A&OX3. NAD. Non-toxic-appearing HEENT: Atraumatic and Normocephalic.  PERRLA. EOM intact.  TM clear B. Mouth-MMM, post pharynx WNL w/o erythema, No PND. Neck: supple, no JVD. No cervical lymphadenopathy. No thyromegaly Chest/Lungs:   Breathing-non-labored, Good air entry bilaterally, breath sounds normal without rales, rhonchi, or wheezing  CVS: S1 S2 regular, no murmurs, gallops, rubs  Abdomen:  benign Back-TTP over L-S spine and paraspinus muscles.  Full S&ROM.  No CVA tenderness.   Extremities: Bilateral Lower Ext shows no edema, both legs are warm to touch with = pulse throughout.  DTR=B Neurology:  CN II-XII grossly intact, Non focal.   Psych:  TP linear. J/I WNL. Normal speech. Appropriate eye contact and affect.  Skin:  No Rash  Data Review Lab Results  Component Value Date   HGBA1C 5.80 02/13/2014     Assessment & Plan   1. Symptoms of urinary tract infection Increase water intake - GC probe amplification, urine - Urinalysis Dipstick  2. Midline low back pain without sciatica Believe musculoskeletal; also about to start period and may be related bc she often has pain in her back with her period - ibuprofen (ADVIL,MOTRIN) 600 MG tablet; Take 1 tablet (600 mg total) by mouth every 8 (eight) hours as needed.  Dispense: 30 tablet; Refill: 0  3. Elevated BP Check BP 2-3 times/week and record.  Bring to f/up appt.   Patient have been counseled extensively about nutrition and exercise  Return in about 3 weeks (around 10/22/2015) for assign to new PCP; f/up elevated BP.  The patient was given clear instructions to go to ER or return to medical center if  symptoms don't improve, worsen or new problems develop. The patient verbalized understanding. The patient was told to call to get lab results if they haven't heard anything in the next week.     Freeman Caldron, PA-C [facility] [place] [facility], [place]   10/01/2015, 4:26 PMPatient ID: Colleen Bailey, female   DOB: 04/03/1965, 50 y.o.   MRN: [id number]

## 2015-10-01 NOTE — Progress Notes (Signed)
Patient is here for Urinary concerns  Patient declined flu vaccine at this time.  Patient has not taken medication today and patient has eaten today.

## 2015-10-02 LAB — NEISSERIA GONORRHOEAE, PROBE AMP: GC Probe RNA: NOT DETECTED

## 2015-10-11 ENCOUNTER — Telehealth: Payer: Self-pay | Admitting: Internal Medicine

## 2015-10-11 NOTE — Telephone Encounter (Signed)
Patient called office to speak with nurse regarding the lab work that she got done last week. Pt she needs to know the results. Please follow up.  Thank you.

## 2015-10-16 NOTE — Telephone Encounter (Signed)
Review patient's chart. Labs were collected but have not been reviewed bu the ordering provider.

## 2015-10-16 NOTE — Telephone Encounter (Signed)
Do you have lab results for pt

## 2015-10-23 ENCOUNTER — Ambulatory Visit (INDEPENDENT_AMBULATORY_CARE_PROVIDER_SITE_OTHER): Payer: Managed Care, Other (non HMO) | Admitting: Gynecology

## 2015-10-23 ENCOUNTER — Encounter: Payer: Self-pay | Admitting: Gynecology

## 2015-10-23 VITALS — BP 124/86

## 2015-10-23 DIAGNOSIS — Z113 Encounter for screening for infections with a predominantly sexual mode of transmission: Secondary | ICD-10-CM

## 2015-10-23 DIAGNOSIS — R300 Dysuria: Secondary | ICD-10-CM | POA: Diagnosis not present

## 2015-10-23 DIAGNOSIS — N912 Amenorrhea, unspecified: Secondary | ICD-10-CM

## 2015-10-23 DIAGNOSIS — N898 Other specified noninflammatory disorders of vagina: Secondary | ICD-10-CM

## 2015-10-23 DIAGNOSIS — N951 Menopausal and female climacteric states: Secondary | ICD-10-CM

## 2015-10-23 LAB — URINALYSIS W MICROSCOPIC + REFLEX CULTURE
Bilirubin Urine: NEGATIVE
CRYSTALS: NONE SEEN [HPF]
Casts: NONE SEEN [LPF]
GLUCOSE, UA: NEGATIVE
HGB URINE DIPSTICK: NEGATIVE
KETONES UR: NEGATIVE
Leukocytes, UA: NEGATIVE
Nitrite: NEGATIVE
PH: 5.5 (ref 5.0–8.0)
PROTEIN: NEGATIVE
RBC / HPF: NONE SEEN RBC/HPF (ref <=2)
Specific Gravity, Urine: 1.01 (ref 1.001–1.035)
YEAST: NONE SEEN [HPF]

## 2015-10-23 LAB — TSH: TSH: 1.67 m[IU]/L

## 2015-10-23 LAB — WET PREP FOR TRICH, YEAST, CLUE
Trich, Wet Prep: NONE SEEN
WBC WET PREP: NONE SEEN
YEAST WET PREP: NONE SEEN

## 2015-10-23 LAB — PREGNANCY, URINE: Preg Test, Ur: NEGATIVE

## 2015-10-23 MED ORDER — METRONIDAZOLE 500 MG PO TABS
500.0000 mg | ORAL_TABLET | Freq: Two times a day (BID) | ORAL | 0 refills | Status: DC
Start: 2015-10-23 — End: 2016-02-05

## 2015-10-23 NOTE — Patient Instructions (Addendum)
Perimenopausia (Perimenopause) La perimenopausia es el momento en que su cuerpo comienza a pasar a la menopausia (sin menstruacin durante 12 meses consecutivos). Es un proceso natural. La perimenopausia puede comenzar entre 2 y 47 aos antes de la menopausia y por lo general tiene una duracin de 1 ao ms pasada la menopausia. [employer], los ovarios podran producir un vulo o no. Los ovarios varan su produccin de las hormonas estrgeno y Technical brewer. Esto puede causar perodos menstruales irregulares, dificultad para quedar embarazada, hemorragia vaginal entre perodos y sntomas incmodos. CAUSAS  Produccin irregular de las hormonas ovricas estrgeno y Immunologist, y no ovular todos los meses.  Otras causas son:  Tumor de la glndula pituitaria.  Enfermedades que [employer] ovarios.  Radioterapia.  Quimioterapia.  Causas desconocidas.  Fumar mucho y abusar del consumo de alcohol puede llevar a que la perimenopausia aparezca antes. SIGNOS Y SNTOMAS   Acaloramiento.  Sudoracin nocturna.  Perodos menstruales irregulares.  Disminucin del deseo sexual.  Sequedad vaginal.  Dolores de cabeza.  Cambios en el estado de nimo.  Depresin.  Problemas de memoria.  Irritabilidad.  Cansancio.  Aumento de [place].  Problemas para quedar embarazada.  Prdida de clulas seas (osteoporosis).  Comienzo de endurecimiento de las arterias (aterosclerosis). DIAGNSTICO  El mdico realizar un diagnstico en funcin de su edad, historial de perodos menstruales y sntomas. Le realizarn un examen fsico para ver si hay algn cambio en su cuerpo, en especial en sus rganos reproductores. Las pruebas hormonales pueden ser o no tiles segn la cantidad de hormonas femeninas que produzca y [employer] produzca. Sin embargo, podrn Microbiologist pruebas hormonales para Statistician. TRATAMIENTO  En algunos casos, no se necesita tratamiento. La  decisin acerca de qu tratamiento es necesario durante la perimenopausia deber realizarse en conjunto con su mdico segn cmo estn afectando los sntomas a su estilo de vida. Existen varios tratamientos disponibles, como:  Risk manager cada sntoma individual con medicamentos especficos para ese sntoma.  Algunos medicamentos herbales pueden ayudar en sntomas especficos.  Psicoterapia.  Terapia grupal. INSTRUCCIONES PARA EL CUIDADO EN EL HOGAR   Controle sus periodos menstruales (cundo ocurren, qu tan abundantes son, cunto tiempo pasa entre perodos, y cunto duran) como tambin sus sntomas y cundo comenzaron.  Tome slo medicamentos de venta libre o recetados, segn las indicaciones del mdico.  Duerma y descanse.  Haga actividad fsica.  Consuma una dieta que contenga calcio (bueno para los Cruger) y productos derivados de la soja (actan como estrgenos).  No fume.  Evite las bebidas alcohlicas.  Tome los suplementos vitamnicos segn las indicaciones del mdico. En ciertos casos, puede ser de [place] tomar vitamina E.  Tome suplementos de calcio y vitamina D para ayudar a Publishing rights manager prdida sea.  En algunos casos la terapia de grupo podr ayudarla.  La acupuntura puede ser de ayuda en ciertos casos. SOLICITE ATENCIN MDICA SI:   Tiene preguntas acerca de sus sntomas.  Necesita ser derivada a un especialista (gineclogo, psiquiatra, o psiclogo). SOLICITE ATENCIN MDICA DE INMEDIATO SI:   Sufre una hemorragia vaginal abundante.  Su perodo menstrual dura ms de 8 das.  Sus perodos son recurrentes cada menos de [address]  Tiene hemorragias durante las [employer].  Est muy deprimido.  Siente dolor al [employer].  Siente dolor de cabeza intenso.  Tiene problemas de visin.   Esta informacin no tiene Marine scientist el consejo del mdico. Asegrese de hacerle al mdico cualquier pregunta que tenga.  Document Released: 12/30/2004 Document  Revised: 10/20/2012 Elsevier Interactive Patient Education 2016 [employer].  Metronidazole tablets or capsules Qu es este medicamento? El METRONIDAZOL es un antiinfeccioso. Se utiliza en el tratamiento de ciertos tipos de infecciones bacterianas y por protozoos. No es efectivo para resfros, gripe u otras infecciones de origen viral. Este medicamento puede ser utilizado para otros usos; si tiene alguna pregunta consulte con su proveedor de atencin mdica o con su farmacutico. Qu le debo informar a mi profesional de la salud antes de tomar este medicamento? Necesita saber si usted presenta alguno de los siguientes problemas o situaciones: -anemia u otros trastornos sanguneos -enfermedad del sistema nervioso -infeccin mictica o por levadura -si consume bebidas alcohlicas -enfermedad heptica -convulsiones -una reaccin alrgica o inusual al metronidazol, a otros medicamentos, alimentos, colorantes o conservantes -si est embarazada o buscando quedar embarazada -si est amamantando a un beb Cmo debo utilizar este medicamento? Tome este medicamento por va oral con un vaso lleno de agua. Siga las instrucciones de la etiqueta del [place]. Tome sus dosis a intervalos regulares. No tome su medicamento con una frecuencia mayor a la indicada. Complete todo el tratamiento con el medicamento como recetado aun si se siente mejor. No omita ninguna dosis o suspenda el uso de su medicamento antes de lo indicado. Hable con su pediatra para informarse acerca del uso de este medicamento en nios. Puede requerir atencin especial. Sobredosis: Pngase en contacto inmediatamente con un centro toxicolgico o una sala de urgencia si usted cree que haya tomado demasiado medicamento. ATENCIN: [employer] es solo para usted. No comparta este medicamento con nadie. Qu sucede si me olvido de una dosis? Si olvida una dosis, tmela lo antes posible. Si es casi la hora de la prxima dosis, tome  slo esa dosis. No tome dosis adicionales o dobles. Qu puede interactuar con este medicamento? No tome esta medicina con ninguno de los siguientes medicamentos: -alcohol o cualquier producto que contiene alcohol -solucin oral de amprenavir -cisapride -disulfiram -dofetilida -dronedarona -inyeccin de paclitaxel -pimozida -solucin oral de ritonavir -solucin oral de sertralina -inyeccin de sulfametoxasol-trimetoprima -tioridazina -ziprasidona Esta medicina tambin puede interactuar con los siguientes medicamentos: -pldoras anticonceptivas -cimetidina -litio -otros medicamentos que prolongan el intervalo QT (provoca un ritmo cardiaco anormal) -fenobarbital -fenitona -warfarina Puede ser que esta lista no menciona todas las posibles interacciones. Informe a su profesional de [employer]	[place] de [employer] productos a base de hierbas, medicamentos de [place] o suplementos nutritivos que est tomando. Si usted fuma, consume bebidas alcohlicas o si utiliza drogas ilegales, indqueselo tambin a su profesional de [employer]	[place]. Algunas sustancias pueden interactuar con su medicamento. A qu debo estar atento al usar [employer]? Consulte a su mdico o a su profesional de la salud si sus sntomas no mejoran o si empeoran. Puede experimentar mareos o somnolencia. No conduzca ni utilice maquinaria ni haga nada que Associate Professor en estado de alerta hasta que sepa cmo le afecta este medicamento. No se siente ni se ponga de pie con rapidez, especialmente si es un paciente de edad avanzada. Esto reduce el riesgo de mareos o [employer]. Evite las bebidas alcohlicas durante el tratamiento con este medicamento y [employer] Stores tres das siguientes. El alcohol puede causarle mareos o hacerlo sentir enfermo o ruborizado. Si est recibiendo tratamiento para una enfermedad de transmisin sexual, no tenga relaciones sexuales hasta que haya completado el [place]. Es posible que su pareja tambin  necesite [place]. Qu efectos secundarios puedo tener al [employer] este medicamento? Efectos secundarios  que debe informar a su mdico o a Barrister's clerk de la salud tan pronto como sea posible: -Chief of Staff como erupcin cutnea o urticarias, hinchazn de la cara, labios o lengua -confusin, torpeza -dificultad para hablar -mareos -decoloracin o dolor de la boca -fiebre, infeccin -entumecimiento, hormigueo, dolor o debilidad en las manos o los pies -dificultad para orinar o cambios en el volumen de orina -enrojecimiento, formacin de ampollas, descamacin o distensin de la piel, inclusive dentro de la boca -convulsiones -cansancio o debilidad inusual -irritacin, resequedad o flujo de la vagina Efectos secundarios que, por lo general, no requieren atencin mdica (debe informarlos a su mdico o a su profesional de la salud si persisten o si son molestos): -diarrea -dolor de cabeza -irritabilidad -sabor metlico -nuseas -calambres o dolores estomacales -dificultad para conciliar el sueo Puede ser que esta lista no menciona todos los posibles efectos secundarios. Comunquese a su mdico por asesoramiento mdico [employer]. Usted puede informar los efectos secundarios a la FDA por telfono al 1-800-FDA-1088. Dnde debo guardar mi medicina? Mantngala fuera del alcance de los nios. [place]dela a [employer], menos de 25 grados C (77 grados F). Protjala de la luz. Mantenga el envase bien cerrado. Deseche todo el medicamento que no haya utilizado, despus de la fecha de vencimiento. ATENCIN: Este folleto es un resumen. Puede ser que no cubra toda la posible informacin. Si usted tiene preguntas acerca de esta medicina, consulte con su mdico, su farmacutico o su profesional de Technical sales engineer.    2016, Elsevier/Gold Standard. (2014-02-21 00:00:00)  Vaginosis bacteriana (Bacterial Vaginosis) La vaginosis bacteriana es una infeccin vaginal que  perturba el equilibrio normal de las bacterias que se encuentran en la vagina. Es el resultado de un crecimiento excesivo de ciertas bacterias. Esta es la infeccin vaginal ms frecuente en mujeres en edad reproductiva. El tratamiento es importante para prevenir complicaciones, especialmente en mujeres embarazadas, dado que puede causar un parto prematuro. CAUSAS  La vaginosis bacteriana se origina por un aumento de bacterias nocivas que, generalmente, estn presentes en cantidades ms pequeas en la vagina. Varios tipos diferentes de bacterias pueden causar esta afeccin. Sin embargo, la causa de su desarrollo no se comprende totalmente. [place] o comportamientos pueden exponerlo a un mayor riesgo de desarrollar vaginosis bacteriana, entre los que se incluyen: Tener una nueva pareja sexual o mltiples parejas sexuales. Las duchas vaginales El uso del DIU (dispositivo intrauterino) como mtodo anticonceptivo. El contagio no se produce en baos, por ropas de cama, en piscinas o por contacto con objetos. SIGNOS Y SNTOMAS  Algunas mujeres que padecen vaginosis bacteriana no presentan signos ni sntomas. Los sntomas ms comunes son: Secrecin vaginal de color grisceo. Secrecin vaginal con olor similar al [employer], especialmente despus de Retail banker. Picazn o sensacin de ardor en la vagina o la vulva. Ardor o dolor al [employer]. DIAGNSTICO  Su mdico analizar su historia clnica y le examinar la vagina para detectar signos de vaginosis bacteriana. Puede tomarle Truddie Coco de flujo vaginal. Su mdico examinar esta muestra con un microscopio para controlar las bacterias y clulas anormales. Tambin puede realizarse un anlisis del pH vaginal.  TRATAMIENTO  La vaginosis bacteriana puede tratarse con antibiticos, en forma de comprimidos o de crema vaginal. Puede indicarse una segunda tanda de antibiticos si la afeccin se repite despus del  tratamiento. Debido a que la vaginosis bacteriana aumenta el riesgo de contraer enfermedades de transmisin sexual, el tratamiento puede ayudar a reducir el riesgo de clamidia, [place],  VIH y herpes. [place] solo medicamentos de venta libre o recetados, segn las indicaciones del mdico. Si le han recetado antibiticos, tmelos como se le indic. Asegrese de que finaliza la prescripcin completa aunque se sienta mejor. Comunique a sus compaeros sexuales que sufre una infeccin vaginal. Deben consultar a su mdico y recibir tratamiento si tienen problemas, como picazn o una erupcin cutnea leve. Durante el [place], es importante que siga estas indicaciones: Visual merchandiser relaciones sexuales o use preservativos de la forma correcta. No se haga duchas vaginales. Evite consumir alcohol como se lo haya indicado el mdico. Community education officer se lo haya indicado el mdico. SOLICITE ATENCIN MDICA SI:  Sus sntomas no mejoran despus de 3 das de [place]. Aumenta la secrecin o Conservation officer, historic buildings. Tiene fiebre. ASEGRESE DE QUE:  Comprende estas instrucciones. Controlar su afeccin. Recibir ayuda de inmediato si no mejora o si empeora. PARA OBTENER MS INFORMACIN  Centros para el control y la prevencin de Probation officer for Disease Control and Prevention, CDC): AppraiserFraud.fi Asociacin Estadounidense de la Salud Sexual (American Sexual Health Association, SHA): www.ashastd.org    Esta informacin no tiene Marine scientist el consejo del mdico. Asegrese de hacerle al mdico cualquier pregunta que tenga.   Document Released: 04/08/2007 Document Revised: 01/20/2014 Elsevier Interactive Patient Education 2016 [place] de reemplazo hormonal (Hormone Therapy) En la menopausia, su cuerpo comienza a producir menos estrgeno y Immunologist. Esto provoca que el cuerpo deje de tener perodos [employer]. Esto se debe a que el  estrgeno y la progesterona controlan sus perodos y su ciclo menstrual. Ardelia Mems falta de estrgeno puede causar sntomas tales como: Social research officer, government. Sequedad vaginal Piel seca. Prdida del deseo sexual. Riesgo de prdida de hueso (osteoporosis). Cuando esto ocurre, puede elegir realizar Ardelia Mems terapia hormonal para volver a Clinical research associate estrgeno perdido [employer]. Cuando slo se introduce esta hormona, el procedimiento se conoce normalmente como TRE (terapia de reemplazo de [place]). Cuando la hormona progestina se combina con el estrgeno, el procedimiento se conoce normalmente como TH (terapia hormonal). Esto es lo que previamente se conoca como terapia de reemplazo hormonal (TRH). El profesional que le asiste le ayudar a tomar una decisin acerca de lo que resulte lo mejor para usted. La decisin de realizar una TRH cambia a menudo debido a que se Risk manager. Muchos estudios no ponen de acuerdo con respecto a los beneficios de Optometrist una terapia de reemplazo hormonal.  BENEFICIOS PROBABLES DE LA TRH QUE INCLUYEN PROTECCIN CONTRA: Golpes de calor - Un golpe de calor es la sensacin repentina de calor sobre la cara y el cuerpo. La piel enrojece, como al sonrojarse. Estn asociados con la transpiracin y los trastornos del sueo. Las mujeres que atraviesan la menopausia pueden tener golpes de calor unas pocas veces en el mes o varios al da; esto depende de la mujer. Osteoporosis (prdida de hueso) - El estrgeno ayuda a protegerse contra la prdida de [place]. Luego de la [place], los huesos de una mujer pierden calcio y se vuelven frgiles y Publishing rights manager. Como resultado, es ms probable que el hueso se [place]. Los que resultan afectados con mayor frecuencia son los de la cadera, la [place] y la columna vertebral. La terapia hormonal puede ayudar a retardar la prdida de hueso luego de la menopausia. Realizar ejercicios con peso y tomar calcio con vitamina D tambin puede ayudar a  prevenir la prdida de [place]. Existen medicamentos que puede prescribir [employer]  la asiste para ayudar a prevenir la osteoporosis. Sequedad vaginal - La prdida de estrgeno produce cambios en la vagina. El recubrimiento de la misma puede volverse fino y Education officer, museum. Estos cambios pueden causar dolor y [place]. La sequedad tambin puede producir una infeccin. Puede ocasionarle ardor y [place]. Las infecciones en las vas urinarias son ms comunes luego de la menopausia debido a la falta de Oceanographer. Otros beneficios posibles del estrgeno incluyen un cambio positivo en el humor y en la memoria de corto plazo en las mujeres. EFECTOS SECUNDARIOS Y RIESGOS Utilizar estrgeno slo sin progesterona causa que el recubrimiento del tero crezca. Esto aumenta el riesgo de cncer endometrial. El profesional que la asiste deber darle otra hormona llamada progestina, si usted tiene tero. Las mujeres que realizan una TH combinada (estrgeno y progestina) parecen tener un mayor riesgo de sufrir cncer de mama. El riesgo parece ser [place], [place] aumenta a lo largo del tiempo que se realice la [place]. La terapia combinada tambin hace que el tejido mamario sea levemente ms denso, lo que hace que sea ms difcil leer mamografas (radiografas de mama). Combinada, la terapia de estrgeno y Immunologist puede realizarse todos los das, en cuyo caso podrn Warehouse manager de [place]. La TH puede realizarse de [place] cclica, en cuyo caso tendr perodos menstruales. La TH puede aumentar el riesgo de [place], ataque cardaco, cncer de mama y formacin de cogulos en la pierna. El estrgeno transdrmico (estrgeno que se absorbe a Designer, jewellery de la piel mediante el uso de un parche o una crema) puede tener mejores resultados en los siguientes casos: Colesterol. La presin arterial. Cogulos sanguneos. La presencia de estas afecciones puede indicar que no debe realizarse terapia  hormonal: Cncer de endometrio. Enfermedad heptica. Cncer de mama. Cardiopata. Antecedentes de cogulos sanguneos. Ictus. TRATAMIENTO Si decide realizar Ardelia Mems TH y tiene tero, normalmente se prescribe el uso de estrgeno y progestina. El profesional que la asiste le ayudar a decidir la mejor forma de [employer]. [employer] formas posibles de administracin de [place], se incluyen las siguientes: [place]. Parches. Geles. Aerosoles. Crema, anillos y vulos vaginales con estrgeno. Lo mejor es Chief Executive Officer dosis posible que pueda ayudarla con sus sntomas y tomarlos durante la menor cantidad de tiempo posible. La terapia hormonal puede ayudar a Banker de los problemas (sntomas) que afectan a las mujeres durante la menopausia. Antes de tomar una decisin con respecto a la TH, converse con el profesional que la asiste acerca de qu es lo mejor para usted. Mantngase bien informada y sintase cmoda con sus decisiones. INSTRUCCIONES PARA EL CUIDADO DOMICILIARIO: [place] indicaciones del profesional con respecto a cmo Biomedical scientist. Se realiza una prueba de Papanicolaou para detectar el cncer de cuello del tero. El primer Papanicolaou debe realizarse a los 21 aos. La prueba se repite cada 2 aos entre los 21 y los 29 aos. Despus de los 30 aos, debe realizarse una prueba de Papanicolaou cada 3 aos siempre que los 3 estudios anteriores hayan sido normales. Algunas mujeres presentan problemas mdicos que aumentan la probabilidad de Museum/gallery curator cncer de cuello del tero. Consulte a su mdico acerca de estos problemas. Es muy importante que le informe a su mdico si aparecen nuevos problemas poco despus de su ltimo Papanicolaou. En estos casos, el mdico podr indicar que se realice el Papanicolaou con ms frecuencia. [place] [employer] son las mismas para las mujeres que hayan recibido o no la vacuna para el VPH (virus del  papiloma  humano). Si le han realizado una histerectoma por un problema que no era cncer o una afeccin que pudiera causar cncer, ya no necesitar un Papanicolaou. Sin embargo, aunque ya no necesite hacerse un Papanicolaou, es una buena idea hacerse un examen regularmente para asegurarse de que no haya otros problemas. Si tiene entre 76 y 91 aos y ha tenido [employer] estudios de Papanicolaou en los ltimos 10 aos, ya no ser IT sales professional. Sin embargo, aunque ya no necesite hacerse un Papanicolaou, es una buena idea hacerse un examen regularmente para asegurarse de que no haya otros problemas. Si ha recibido un tratamiento para Science writer cervical o una enfermedad que podra causar cncer, necesitar realizarse una prueba de Papanicolaou y controles durante al menos 36 aos de concluido el [place]. Si no se ha [place] [employer] con regularidad, Chief Executive Officer a [employer] factores de riesgo (como el tener un nuevo compaero sexual) para Teacher, adult education si es necesario que se los haga de [place]. Es posible que algunas mujeres deban realizarse exmenes de deteccin con mayor frecuencia si presentan un alto riesgo de padecer cncer de cuello del tero. Hgase controles de [place] regular, e incluya Papanicolau y [place]. SOLICITE ATENCIN MDICA DE INMEDIATO SI PRESENTA: Hemorragia vaginal anormal. Dolor o inflamacin en las piernas, falta de aliento o Tourist information centre manager. Mareos o dolores de [place]. Protuberancias o cambios en sus mamas o axilas. Pronunciacin inarticulada. Debilidad o adormecimiento en los brazos o las piernas. Dolor, ardor o sangrado al [employer]. Dolor abdominal.   Esta informacin no tiene como fin reemplazar el consejo del mdico. Asegrese de hacerle al mdico cualquier pregunta que tenga.   Document Released: 06/18/2007 Document Revised: 05/16/2014 Elsevier Interactive Patient Education [employer].

## 2015-10-23 NOTE — Progress Notes (Signed)
HPI: Patient is a 50 year old who presented to the office today with several complaints. Patient was somewhat anxious and wanted to be considered for an STD screen since she became active with a new partner last week. She thought she had some irritation vaginally and applied over-the-counter Monistat last night. She states that at the tail end of voiding is when she feels or burning. She denies otherwise any fever, chills, nausea, vomiting or any true dysuria. She also is complaining not having a menstrual cycle in 3 months. She denies any vasomotor symptoms. She denies any galactorrhea or visual disturbances or any unusual headache. Patient several years ago had tubal sterilization.   ROS: A ROS was performed and pertinent positives and negatives are included in the history.  GENERAL: No fevers or chills. HEENT: No change in vision, no earache, sore throat or sinus congestion. NECK: No pain or stiffness. CARDIOVASCULAR: No chest pain or pressure. No palpitations. PULMONARY: No shortness of breath, cough or wheeze. GASTROINTESTINAL: No abdominal pain, nausea, vomiting or diarrhea, melena or bright red blood per rectum. GENITOURINARY: No urinary frequency, urgency, hesitancy or dysuria. MUSCULOSKELETAL: No joint or muscle pain, no back pain, no recent trauma. DERMATOLOGIC: No rash, no itching, no lesions. ENDOCRINE: No polyuria, polydipsia, no heat or cold intolerance. No recent change in weight. HEMATOLOGICAL: No anemia or easy bruising or bleeding. NEUROLOGIC: No headache, seizures, numbness, tingling or weakness. PSYCHIATRIC: No depression, no loss of interest in normal activity or change in sleep pattern.   PE: Blood pressure 124/86 Gen. appearance well-developed well-nourished female with the above-mentioned complaining Back: No CVA tenderness Abdomen: Soft nontender no rebound or guarding Pelvic: Bartholin urethra Skene was within normal limits Vagina cream noted in the vagina from previous  over-the-counter Monistat the patient had used Cervix: No gross lesions on inspection Bimanual exam: Uterus anteverted normal size shape and consistency nontender Adnexa: No palpable masses or tenderness Rectal exam not done  Urinalysis: Clue cells present moderate bacteria, 0-5 WBC, no red blood cells seen  Wet prep many clue cells too numerous to count bacteria    Assessment Plan: #1 amenorrhea patient 51 years of age probably perimenopausal we'll check an [facility] today. #2 clinical evidence of bacterial vaginosis will be treated with Flagyl 500 mg twice a day for 7 days #3 STD screen done today includes the following: GC and Chlamydia culture, HIV, RPR, hepatitis B and C  Literature information on the perimenopause, hormonal replacement therapy, bacterial vaginosis, and Flagyl was provided in [place]. Patient scheduled to return to the office at the end of the month for annual exam will further discuss the results of [facility] but will contact her many of the above mentioned tests are abnormal.    Greater than 50% of time was spent in counseling and coordinating care of this patient.   Time of consultation: 25   Minutes.

## 2015-10-24 LAB — URINE CULTURE

## 2015-10-24 LAB — HIV ANTIBODY (ROUTINE TESTING W REFLEX): HIV 1&2 Ab, 4th Generation: NONREACTIVE

## 2015-10-24 LAB — HEPATITIS B SURFACE ANTIGEN: HEP B S AG: NEGATIVE

## 2015-10-24 LAB — GC/CHLAMYDIA PROBE AMP
CT PROBE, AMP APTIMA: NOT DETECTED
GC PROBE AMP APTIMA: NOT DETECTED

## 2015-10-24 LAB — RPR

## 2015-10-24 LAB — FOLLICLE STIMULATING HORMONE: FSH: 8.5 m[IU]/mL

## 2015-10-24 LAB — HEPATITIS C ANTIBODY: HCV Ab: NEGATIVE

## 2015-10-24 LAB — PROLACTIN: PROLACTIN: 17.3 ng/mL

## 2015-10-25 ENCOUNTER — Other Ambulatory Visit: Payer: Self-pay | Admitting: Gynecology

## 2015-10-25 DIAGNOSIS — R8271 Bacteriuria: Secondary | ICD-10-CM

## 2015-10-30 ENCOUNTER — Other Ambulatory Visit: Payer: Managed Care, Other (non HMO)

## 2015-11-12 ENCOUNTER — Encounter: Payer: Managed Care, Other (non HMO) | Admitting: Gynecology

## 2015-11-26 ENCOUNTER — Encounter: Payer: Self-pay | Admitting: Gynecology

## 2015-11-26 ENCOUNTER — Ambulatory Visit (INDEPENDENT_AMBULATORY_CARE_PROVIDER_SITE_OTHER): Payer: Managed Care, Other (non HMO) | Admitting: Gynecology

## 2015-11-26 VITALS — BP 128/82 | Ht <= 58 in | Wt 145.0 lb

## 2015-11-26 DIAGNOSIS — Z01411 Encounter for gynecological examination (general) (routine) with abnormal findings: Secondary | ICD-10-CM | POA: Diagnosis not present

## 2015-11-26 DIAGNOSIS — N951 Menopausal and female climacteric states: Secondary | ICD-10-CM | POA: Insufficient documentation

## 2015-11-26 DIAGNOSIS — E782 Mixed hyperlipidemia: Secondary | ICD-10-CM | POA: Diagnosis not present

## 2015-11-26 LAB — TSH: TSH: 2.79 mIU/L

## 2015-11-26 LAB — CBC WITH DIFFERENTIAL/PLATELET
BASOS ABS: 0 {cells}/uL (ref 0–200)
Basophils Relative: 0 %
EOS PCT: 5 %
Eosinophils Absolute: 475 cells/uL (ref 15–500)
HCT: 36.8 % (ref 35.0–45.0)
HEMOGLOBIN: 12 g/dL (ref 11.7–15.5)
LYMPHS ABS: 2375 {cells}/uL (ref 850–3900)
Lymphocytes Relative: 25 %
MCH: 26.6 pg — AB (ref 27.0–33.0)
MCHC: 32.6 g/dL (ref 32.0–36.0)
MCV: 81.6 fL (ref 80.0–100.0)
MONOS PCT: 6 %
MPV: 7.8 fL (ref 7.5–12.5)
Monocytes Absolute: 570 cells/uL (ref 200–950)
NEUTROS ABS: 6080 {cells}/uL (ref 1500–7800)
Neutrophils Relative %: 64 %
PLATELETS: 280 10*3/uL (ref 140–400)
RBC: 4.51 MIL/uL (ref 3.80–5.10)
RDW: 14 % (ref 11.0–15.0)
WBC: 9.5 10*3/uL (ref 3.8–10.8)

## 2015-11-26 LAB — COMPREHENSIVE METABOLIC PANEL
ALBUMIN: 3.8 g/dL (ref 3.6–5.1)
ALT: 9 U/L (ref 6–29)
AST: 13 U/L (ref 10–35)
Alkaline Phosphatase: 82 U/L (ref 33–115)
BILIRUBIN TOTAL: 0.5 mg/dL (ref 0.2–1.2)
BUN: 10 mg/dL (ref 7–25)
CHLORIDE: 102 mmol/L (ref 98–110)
CO2: 25 mmol/L (ref 20–31)
CREATININE: 0.62 mg/dL (ref 0.50–1.10)
Calcium: 8.9 mg/dL (ref 8.6–10.2)
Glucose, Bld: 111 mg/dL — ABNORMAL HIGH (ref 65–99)
Potassium: 4.1 mmol/L (ref 3.5–5.3)
SODIUM: 135 mmol/L (ref 135–146)
TOTAL PROTEIN: 6.9 g/dL (ref 6.1–8.1)

## 2015-11-26 LAB — LIPID PANEL
CHOLESTEROL: 205 mg/dL — AB (ref <200)
HDL: 58 mg/dL (ref >50)
LDL CALC: 107 mg/dL — AB (ref <100)
Total CHOL/HDL Ratio: 3.5 Ratio (ref <5.0)
Triglycerides: 199 mg/dL — ABNORMAL HIGH (ref <150)
VLDL: 40 mg/dL — AB (ref <30)

## 2015-11-26 MED ORDER — NYSTATIN-TRIAMCINOLONE 100000-0.1 UNIT/GM-% EX CREA: 1.0000 "application " | TOPICAL_CREAM | Freq: Three times a day (TID) | CUTANEOUS | 2 refills | Status: DC

## 2015-11-26 NOTE — Patient Instructions (Signed)
Ejercicios para perder peso (Exercise to Lose Weight) La actividad fsica y Ardelia Mems dieta saludable ayudan a perder peso. El mdico podr sugerirle ejercicios especficos. IDEAS Y CONSEJOS PARA HACER EJERCICIOS  Elija opciones econmicas que disfrute hacer , como caminar, andar en bicicleta o los vdeos para ejercitarse.   Utilice las Clinical cytogeneticist del ascensor.   Camine durante la hora del almuerzo.   Estacione el auto lejos del lugar de [place] o [place].   Concurra a un gimnasio o tome clases de gimnasia.   Comience con 5  10 minutos de actividad fsica por da. Ejercite hasta 30 minutos, 4 a 6 das por semana.   Utilice zapatos que tengan un buen soporte y ropas cmodas.   Elongue antes y despus de Chief Technology Officer.   Ejercite hasta que aumente la respiracin y el corazn palpite rpido.   Beba agua extra cuando ejercite.   No haga ejercicio Contractor, sentirse mareado o que le falte mucho el aire.  La actividad fsica puede quemar alrededor de 150 caloras.  Correr 20 cuadras en 15 minutos.   Jugar vley durante 45 a 60 minutos.   Limpiar y encerar el auto durante 45 a 60 minutos.   Jugar ftbol americano de toque.   Caminar 25 cuadras en 35 minutos.   Empujar un cochecito 20 cuadras en 30 minutos.   Jugar baloncesto durante 30 minutos.   Rastrillar hojas secas durante 30 minutos.   Andar en bicicleta 80 cuadras en 30 minutos.   Caminar 30 cuadras en 30 minutos.   Bailar durante 30 minutos.   Quitar la nieve con una pala durante 15 minutos.   Nadar vigorosamente durante 20 minutos.   Subir escaleras durante 15 minutos.   Andar en bicicleta 60 cuadras durante 15 minutos.   Arreglar el jardn entre 30 y 53 minutos.   Saltar a la soga durante 15 minutos.   Limpiar vidrios o pisos durante 45 a 60 minutos.  Document Released: 04/05/2010 Document Revised: 09/11/2010 [facility] Patient Information 2012 [place].Opciones de alimentos para Medical laboratory scientific officer de triglicridos (Food Choices to Lower Your Triglycerides) Los triglicridos son un tipo de grasas que se Chief Strategy Officer. Un nivel elevado de triglicridos puede aumentar el riesgo de padecer enfermedades cardacas e infartos. Si sus niveles de triglicridos son altos, los alimentos que se ingieren y los hbitos de alimentacin son Theatre stage manager. Elegir los alimentos adecuados puede ayudar a Therapist, nutritional de triglicridos.  Excel?  Baje de peso si es necesario.  Limite o evite el alcohol.  Llene la mitad del plato con vegetales y ensaladas de hojas verdes.  Kiln a dos porciones por da. Elija frutas en lugar de jugos.  Ocupe un cuarto del plato con cereales integrales. Busque la palabra "integral" en Equities trader de la lista de ingredientes.  Llene un cuarto del plato con alimentos con protenas magras.  Disfrute de pescados grasos (como salmn, caballa, sardinas y atn) tres veces por semana.  Ronco grasas saludables.  Limite los alimentos con alto contenido de almidn y Location manager.  Consuma ms comida casera y menos de restaurante, de buf y comida rpida.  Limite el consumo de alimentos fritos.  Cocine los alimentos utilizando mtodos que no sean la fritura.  Limite el consumo de grasas saturadas.  Verifique las listas de ingredientes para evitar alimentos con aceites parcialmente hidrogenados (grasas trans). QU ALIMENTOS PUEDO COMER?  Cereales Cereales integrales, como los panes de  salvado o integrales, las galletas, los cereales y las pastas. Avena sin endulzar, trigo, [place], quinua o arroz integral. Tortillas de harina de maz o de salvado.  Vegetales Verduras frescas o congeladas (crudas, al vapor, asadas o grilladas). Ensaladas de hojas verdes. Fruits Frutas frescas, en conserva (en su jugo natural) o frutas congeladas. Carnes y otros productos con protenas Carne de res molida (al 85% o ms [place]),  carne de res de animales alimentados con pastos o carne de res sin la grasa. Pollo o pavo sin piel. Carne de pollo o de [place]. Cerdo sin la grasa. Todos los pescados y frutos de mar. Huevos. Porotos, guisantes o lentejas secos. Frutos secos o semillas sin sal. Frijoles secos o en lata sin sal. Lcteos Productos lcteos con bajo contenido de grasas, como [place] o al 1%, quesos reducidos en grasas o al 2%, ricota con bajo contenido de grasas o [employer], o yogur natural con bajo contenido de [place]. Grasas y Naval architect en barra que no contengan grasas trans. [place] y condimentos para ensaladas livianos o reducidos en grasas. [place]. Aceites de crtamo, oliva o canola. [place] natural de man o almendra. Los artculos mencionados arriba pueden no ser [employer] Foods Company de las bebidas o los alimentos recomendados. Comunquese con el nutricionista para conocer ms opciones. QU ALIMENTOS NO SE RECOMIENDAN?  Cereales Pan blanco. Pastas blancas. Arroz blanco. Pan de maz. Bagels, pasteles y croissants. Galletas saladas que contengan grasas trans. Vegetales Papas blancas. Maz. Vegetales con crema o fritos. Verduras en [place]. Fruits Frutas secas. Fruta enlatada en almbar liviano o espeso. Jugo de frutas. Carnes y otros productos con protenas Cortes de carne con Lobbyist. Costillas, alas de pollo, tocineta, salchicha, mortadela, salame, chinchulines, tocino, perros calientes, salchichas alemanas y embutidos envasados. Lcteos Leche entera o al 2%, crema, mezcla de [place] y crema y queso crema. Yogur entero o endulzado. Quesos con toda su grasa. Cremas no lcteas y coberturas batidas. Quesos procesados, quesos para untar o cuajadas. Dulces y postres Jarabe de maz, azcares, miel y Control and instrumentation engineer. Caramelos. Mermelada y [place]. Chrissie Noa. Cereales endulzados. Galletas, pasteles, bizcochuelos, donas, muffins y helado. Grasas y aceites [place], [place] en barra,  [place] de [place], [place], [place] clarificada o grasa de tocino. Aceites de coco, de palmiste o de palma. Bebidas Alcohol. Bebidas endulzadas (como refrescos, limonadas y bebidas frutales o ponches). Los artculos mencionados arriba pueden no ser [employer] Foods Company de las bebidas y los alimentos que se Higher education careers adviser. Comunquese con el nutricionista para recibir ms informacin.   Esta informacin no tiene Marine scientist el consejo del mdico. Asegrese de hacerle al mdico cualquier pregunta que tenga.   Document Released: 06/19/2009 Document Revised: 01/04/2013 Elsevier Interactive Patient Education 2016 [place] de reemplazo hormonal (Hormone Therapy) En la menopausia, su cuerpo comienza a producir menos estrgeno y Immunologist. Esto provoca que el cuerpo deje de tener perodos Volo. Esto se debe a que el estrgeno y la progesterona controlan sus perodos y su ciclo menstrual. Ardelia Mems falta de estrgeno puede causar sntomas tales como:  Social research officer, government.  Sequedad vaginal  Piel seca.  Prdida del deseo sexual.  Riesgo de prdida de hueso (osteoporosis). Cuando esto ocurre, puede elegir realizar Ardelia Mems terapia hormonal para volver a Clinical research associate estrgeno perdido [employer]. Cuando slo se introduce esta hormona, el procedimiento se conoce normalmente como TRE (terapia de reemplazo de [place]). Cuando la hormona progestina se combina con el estrgeno, el procedimiento se conoce normalmente como TH (terapia hormonal).  Esto es lo que previamente se conoca como terapia de reemplazo hormonal (TRH). El profesional que le asiste le ayudar a tomar una decisin acerca de lo que resulte lo mejor para usted. La decisin de realizar una TRH cambia a menudo debido a que se Risk manager. Muchos estudios no ponen de acuerdo con respecto a los beneficios de Optometrist una terapia de reemplazo hormonal.  BENEFICIOS PROBABLES DE LA TRH QUE INCLUYEN PROTECCIN  CONTRA:  Golpes de calor - Un golpe de calor es la sensacin repentina de calor sobre la cara y el cuerpo. La piel enrojece, como al sonrojarse. Estn asociados con la transpiracin y los trastornos del sueo. Las mujeres que atraviesan la menopausia pueden tener golpes de calor unas pocas veces en el mes o varios al da; esto depende de la mujer.  Osteoporosis (prdida de hueso) - El estrgeno ayuda a protegerse contra la prdida de [place]. Luego de la [place], los huesos de una mujer pierden calcio y se vuelven frgiles y Publishing rights manager. Como resultado, es ms probable que el hueso se [place]. Los que resultan afectados con mayor frecuencia son los de la cadera, la [place] y la columna vertebral. La terapia hormonal puede ayudar a retardar la prdida de hueso luego de la menopausia. Realizar ejercicios con peso y tomar calcio con vitamina D tambin puede ayudar a prevenir la prdida de [place]. Existen medicamentos que puede prescribir el profesional que la asiste para ayudar a prevenir la osteoporosis.  Sequedad vaginal - La prdida de estrgeno produce cambios en la vagina. El recubrimiento de la misma puede volverse fino y Education officer, museum. Estos cambios pueden causar dolor y [place]. La sequedad tambin puede producir una infeccin. Puede ocasionarle ardor y [place].  Las infecciones en las vas urinarias son ms comunes luego de la menopausia debido a la falta de Oceanographer.  Otros beneficios posibles del estrgeno incluyen un cambio positivo en el humor y en la memoria de corto plazo en las mujeres. EFECTOS SECUNDARIOS Y RIESGOS  Utilizar estrgeno slo sin progesterona causa que el recubrimiento del tero crezca. Esto aumenta el riesgo de cncer endometrial. El profesional que la asiste deber darle otra hormona llamada progestina, si usted tiene tero.  Las mujeres que realizan una TH combinada (estrgeno y progestina) parecen tener un mayor riesgo de sufrir cncer de mama.  El riesgo parece ser [place], [place] aumenta a lo largo del tiempo que se realice la [place].  La terapia combinada tambin hace que el tejido mamario sea levemente ms denso, lo que hace que sea ms difcil leer mamografas (radiografas de mama).  Combinada, la terapia de estrgeno y Immunologist puede realizarse todos los das, en cuyo caso podrn Warehouse manager de [place]. La TH puede realizarse de [place] cclica, en cuyo caso tendr perodos menstruales.  La TH puede aumentar el riesgo de [place], ataque cardaco, cncer de mama y formacin de cogulos en la pierna.  El estrgeno transdrmico (estrgeno que se absorbe a Designer, jewellery de la piel mediante el uso de un parche o una crema) puede tener mejores resultados en los siguientes casos:  Colesterol.  La presin arterial.  Cogulos sanguneos. La presencia de estas afecciones puede indicar que no debe realizarse terapia hormonal:  Cncer de endometrio.  Enfermedad heptica.  Cncer de mama.  Cardiopata.  Antecedentes de cogulos sanguneos.  Ictus. TRATAMIENTO  Si decide realizar Ardelia Mems TH y tiene tero, normalmente se prescribe el uso de estrgeno y progestina.  El profesional que la asiste le ayudar a  decidir la mejor forma de [employer]el.  [employer] formas posibles de administracin de Oracle, se incluyen las siguientes:  Pldoras.  Parches.  Geles.  Aerosoles.  Crema, anillos y vulos vaginales con estrgeno.  Lo mejor es Chief Executive Officer dosis posible que pueda ayudarla con sus sntomas y tomarlos durante la menor cantidad de tiempo posible.  La terapia hormonal puede ayudar a Banker de los problemas (sntomas) que afectan a las mujeres durante la menopausia. Antes de tomar una decisin con respecto a la TH, converse con el profesional que la asiste acerca de qu es lo mejor para usted. Mantngase bien informada y sintase cmoda con sus decisiones. INSTRUCCIONES PARA EL CUIDADO  DOMICILIARIO:  Albert indicaciones del profesional con respecto a cmo Biomedical scientist.  Se realiza una prueba de Papanicolaou para detectar el cncer de cuello del tero.  El primer Papanicolaou debe realizarse a los 21 aos.  La prueba se repite cada 2 aos entre los 21 y los 29 aos.  Despus de los 30 aos, debe realizarse una prueba de Papanicolaou cada 3 aos siempre que los 3 estudios anteriores hayan sido normales.  Algunas mujeres presentan problemas mdicos que aumentan la probabilidad de Museum/gallery curator cncer de cuello del tero. Consulte a su mdico acerca de estos problemas. Es muy importante que le informe a su mdico si aparecen nuevos problemas poco despus de su ltimo Papanicolaou. En estos casos, el mdico podr indicar que se realice el Papanicolaou con ms frecuencia.  Las Southern Company son las mismas para las mujeres que hayan recibido o no la vacuna para el VPH (virus del papiloma humano).  Si le han realizado una histerectoma por un problema que no era cncer o una afeccin que pudiera causar cncer, ya no necesitar un Papanicolaou. Sin embargo, aunque ya no necesite hacerse un Papanicolaou, es una buena idea hacerse un examen regularmente para asegurarse de que no haya otros problemas.  Si tiene entre 72 y 38 aos y ha tenido [employer] estudios de Papanicolaou en los ltimos 10 aos, ya no ser IT sales professional. Sin embargo, aunque ya no necesite hacerse un Papanicolaou, es una buena idea hacerse un examen regularmente para asegurarse de que no haya otros problemas.  Si ha recibido un tratamiento para Science writer cervical o una enfermedad que podra causar cncer, necesitar realizarse una prueba de Papanicolaou y controles durante al menos 63 aos de concluido el [place]wolf.  Si no se ha [place] [employer] con regularidad, Chief Executive Officer a [employer] factores de riesgo (como el tener un nuevo compaero sexual) para Teacher, adult education si  es necesario que se los haga de [place].  Es posible que algunas mujeres deban realizarse exmenes de deteccin con mayor frecuencia si presentan un alto riesgo de padecer cncer de cuello del tero.  Hgase controles de [place] regular, e incluya Papanicolau y [place]. SOLICITE ATENCIN MDICA DE INMEDIATO SI PRESENTA:  Hemorragia vaginal anormal.  Dolor o inflamacin en las piernas, falta de aliento o Tourist information centre manager.  Mareos o dolores de [place].  Protuberancias o cambios en sus mamas o axilas.  Pronunciacin inarticulada.  Debilidad o adormecimiento en los brazos o las piernas.  Dolor, ardor o sangrado al Continental Airlines.  Dolor abdominal.   Esta informacin no tiene como fin reemplazar el consejo del mdico. Asegrese de hacerle al mdico cualquier pregunta que tenga.   Document Released: 06/18/2007 Document Revised: 05/16/2014 Elsevier Interactive Patient Education 2016 [place] (Perimenopause) La perimenopausia es  el momento en que su cuerpo comienza a pasar a la menopausia (sin menstruacin durante 12 meses consecutivos). Es un proceso natural. La perimenopausia puede comenzar entre 2 y 4 aos antes de la menopausia y por lo general tiene una duracin de 1 ao ms pasada la menopausia. [employer] Inc, los ovarios podran producir un vulo o no. Los ovarios varan su produccin de las hormonas estrgeno y Technical brewer. Esto puede causar perodos menstruales irregulares, dificultad para quedar embarazada, hemorragia vaginal entre perodos y sntomas incmodos. CAUSAS  Produccin irregular de las hormonas ovricas estrgeno y Immunologist, y no ovular todos los meses.  Otras causas son:  Tumor de la glndula pituitaria.  Enfermedades que Continental Airlines ovarios.  Radioterapia.  Quimioterapia.  Causas desconocidas.  Fumar mucho y abusar del consumo de alcohol puede llevar a que la perimenopausia aparezca antes. SIGNOS Y SNTOMAS    Acaloramiento.  Sudoracin nocturna.  Perodos menstruales irregulares.  Disminucin del deseo sexual.  Sequedad vaginal.  Dolores de cabeza.  Cambios en el estado de nimo.  Depresin.  Problemas de memoria.  Irritabilidad.  Cansancio.  Aumento de [place].  Problemas para quedar embarazada.  Prdida de clulas seas (osteoporosis).  Comienzo de endurecimiento de las arterias (aterosclerosis). DIAGNSTICO  El mdico realizar un diagnstico en funcin de su edad, historial de perodos menstruales y sntomas. Le realizarn un examen fsico para ver si hay algn cambio en su cuerpo, en especial en sus rganos reproductores. Las pruebas hormonales pueden ser o no tiles segn la cantidad de hormonas femeninas que produzca y [employer] produzca. Sin embargo, podrn Microbiologist pruebas hormonales para Statistician. TRATAMIENTO  En algunos casos, no se necesita tratamiento. La decisin acerca de qu tratamiento es necesario durante la perimenopausia deber realizarse en conjunto con su mdico segn cmo estn afectando los sntomas a su estilo de vida. Existen varios tratamientos disponibles, como:  Risk manager cada sntoma individual con medicamentos especficos para ese sntoma.  Algunos medicamentos herbales pueden ayudar en sntomas especficos.  Psicoterapia.  Terapia grupal. INSTRUCCIONES PARA EL CUIDADO EN EL HOGAR   Controle sus periodos menstruales (cundo ocurren, qu tan abundantes son, cunto tiempo pasa entre perodos, y cunto duran) como tambin sus sntomas y cundo comenzaron.  Tome slo medicamentos de venta libre o recetados, segn las indicaciones del mdico.  Duerma y descanse.  Haga actividad fsica.  Consuma una dieta que contenga calcio (bueno para los [place]) y productos derivados de la soja (actan como estrgenos).  No fume.  Evite las bebidas alcohlicas.  Tome los suplementos vitamnicos segn las indicaciones del mdico. En  ciertos casos, puede ser de [place] tomar vitamina E.  Tome suplementos de calcio y vitamina D para ayudar a Publishing rights manager prdida sea.  En algunos casos la terapia de grupo podr ayudarla.  La acupuntura puede ser de ayuda en ciertos casos. SOLICITE ATENCIN MDICA SI:   Tiene preguntas acerca de sus sntomas.  Necesita ser derivada a un especialista (gineclogo, psiquiatra, o psiclogo). SOLICITE ATENCIN MDICA DE INMEDIATO SI:   Sufre una hemorragia vaginal abundante.  Su perodo menstrual dura ms de 8 das.  Sus perodos son recurrentes cada menos de [address]  Tiene hemorragias durante las [employer].  Est muy deprimido.  Siente dolor al [employer] Airlines.  Siente dolor de cabeza intenso.  Tiene problemas de visin.   Esta informacin no tiene Marine scientist el consejo del mdico. Asegrese de hacerle al mdico cualquier pregunta que tenga.   Document Released: 12/30/2004 Document  Revised: 10/20/2012 Elsevier Interactive Patient Education [employer].

## 2015-11-26 NOTE — Progress Notes (Signed)
Colleen Bailey [birth year] [id number]   History:    50 y.o.  for annual gyn exam with no complaints today. Patient does state that occasionally she'll skip a month on her menstrual cycle but she is expressing no vasomotor symptoms. Last year she was treated for suspected overactive bladder with Myrbetriq and currently is not using it and is having no symptoms. Review of her record also indicated that her labs last year her total cholesterol was elevated at 202, triglycerides elevated at 183, and VLDL was elevated at 37. She's here fasting for blood work today. Patient with no previous history of any abnormal Pap smears. Patient declined flu vaccine.  Past medical history,surgical history, family history and social history were all reviewed and documented in the EPIC chart.  Gynecologic History Patient's last menstrual period was 10/23/2015. Contraception: tubal ligation Last Pap: 2012 and 2015. Results were: normal Last mammogram: 2016. Results were: Normal but dense  Obstetric History OB History  Gravida Para Term Preterm AB Living  5 3     2 3   SAB TAB Ectopic Multiple Live Births    2          # Outcome Date GA Lbr Len/2nd Weight Sex Delivery Anes PTL Lv  5 TAB           4 TAB           3 Para           2 Para           1 Para                ROS: A ROS was performed and pertinent positives and negatives are included in the history.  GENERAL: No fevers or chills. HEENT: No change in vision, no earache, sore throat or sinus congestion. NECK: No pain or stiffness. CARDIOVASCULAR: No chest pain or pressure. No palpitations. PULMONARY: No shortness of breath, cough or wheeze. GASTROINTESTINAL: No abdominal pain, nausea, vomiting or diarrhea, melena or bright red blood per rectum. GENITOURINARY: No urinary frequency, urgency, hesitancy or dysuria. MUSCULOSKELETAL: No joint or muscle pain, no back pain, no recent trauma. DERMATOLOGIC: No rash, no itching, no lesions. ENDOCRINE: No  polyuria, polydipsia, no heat or cold intolerance. No recent change in weight. HEMATOLOGICAL: No anemia or easy bruising or bleeding. NEUROLOGIC: No headache, seizures, numbness, tingling or weakness. PSYCHIATRIC: No depression, no loss of interest in normal activity or change in sleep pattern.     Exam: chaperone present  BP 128/82   Ht 4\' 10"  (1.473 m)   Wt 145 lb (65.8 kg)   LMP 10/23/2015   BMI 30.31 kg/m   Body mass index is 30.31 kg/m.  General appearance : Well developed well nourished female. No acute distress HEENT: Eyes: no retinal hemorrhage or exudates,  Neck supple, trachea midline, no carotid bruits, no thyroidmegaly Lungs: Clear to auscultation, no rhonchi or wheezes, or rib retractions  Heart: Regular rate and rhythm, no murmurs or gallops Breast:Examined in sitting and supine position were symmetrical in appearance, no palpable masses or tenderness,  no skin retraction, no nipple inversion, no nipple discharge, no skin discoloration, no axillary or supraclavicular lymphadenopathy Abdomen: no palpable masses or tenderness, no rebound or guarding Extremities: no edema or skin discoloration or tenderness  Pelvic:  Bartholin, Urethra, Skene Glands: Within normal limits             Vagina: No gross lesions or discharge  Cervix: No gross lesions or discharge  Uterus  anteverted, normal size, shape and consistency, non-tender and mobile  Adnexa  Without masses or tenderness  Anus and perineum  normal   Rectovaginal  normal sphincter tone without palpated masses or tenderness             Hemoccult not indicated     Assessment/Plan:  50 y.o. female for annual exam was reminded to schedule her mammogram at the request a three-dimensional mammogram as a result of her dense breasts noted in 2016. We'll also encouraged monthly breast exams. Patient declined the flu vaccine. The following fasting screening blood work was ordered today: Fasting lipid profile, comprehensive  metabolic panel, TSH, CBC, and urinalysis. Pap smear not indicated this year according to the guidelines. Literature information Spanish was provided on the following subject: Cholesterol lowering diet, perimenopause, hormonal replacement therapy, and exercise. An Sampson will be drawn today as a result of her skipping cycles to confirm she is perimenopausal although hormone replacement therapy not indicated at this time she is asymptomatic.   Terrance Mass MD, 8:51 AM 11/26/2015

## 2015-11-27 ENCOUNTER — Encounter: Payer: Self-pay | Admitting: Gynecology

## 2015-11-27 LAB — URINALYSIS W MICROSCOPIC + REFLEX CULTURE
Bacteria, UA: NONE SEEN [HPF]
Bilirubin Urine: NEGATIVE
CASTS: NONE SEEN [LPF]
Crystals: NONE SEEN [HPF]
GLUCOSE, UA: NEGATIVE
Hgb urine dipstick: NEGATIVE
Ketones, ur: NEGATIVE
LEUKOCYTES UA: NEGATIVE
NITRITE: NEGATIVE
PH: 5 (ref 5.0–8.0)
Protein, ur: NEGATIVE
RBC / HPF: NONE SEEN RBC/HPF (ref <=2)
SPECIFIC GRAVITY, URINE: 1.014 (ref 1.001–1.035)
WBC, UA: NONE SEEN WBC/HPF (ref <=5)
YEAST: NONE SEEN [HPF]

## 2015-11-27 LAB — FOLLICLE STIMULATING HORMONE: FSH: 7.2 m[IU]/mL

## 2016-02-04 ENCOUNTER — Encounter: Payer: Self-pay | Admitting: *Deleted

## 2016-02-05 ENCOUNTER — Ambulatory Visit (INDEPENDENT_AMBULATORY_CARE_PROVIDER_SITE_OTHER): Payer: Managed Care, Other (non HMO) | Admitting: Sports Medicine

## 2016-02-05 ENCOUNTER — Encounter: Payer: Self-pay | Admitting: Sports Medicine

## 2016-02-05 DIAGNOSIS — M25511 Pain in right shoulder: Secondary | ICD-10-CM

## 2016-02-05 DIAGNOSIS — S83282D Other tear of lateral meniscus, current injury, left knee, subsequent encounter: Secondary | ICD-10-CM

## 2016-02-05 MED ORDER — METHYLPREDNISOLONE ACETATE 40 MG/ML IJ SUSP
40.0000 mg | Freq: Once | INTRAMUSCULAR | Status: AC
  Administered 2016-02-05: 40 mg via INTRA_ARTICULAR

## 2016-02-05 NOTE — Assessment & Plan Note (Addendum)
Likely secondary to rotator cuff tendinopathy given weakness on physical exam. Subacromial injection performed today (see above progress note). She will follow up in 3-4 weeks for her knee. Continue ibuprofen as needed. Can see how injection worked at that time.

## 2016-02-05 NOTE — Progress Notes (Signed)
Subjective:  Colleen Bailey is a 51 y.o. female who presents to the [facility] today with a chief complaint of shoulder Bailey.   HPI:  Right Shoulder Bailey Symptoms started about 2 weeks ago. Bailey mostly located on the lateral aspect of her right upper arm and shoulder.  No trauma or other obvious precipitating event. She was diagnosed with biceps tendonitis about 4 months ago and received a steroid injection which helped. She has not noticed anything that makes the Bailey better or worse. She has tried taking ibuprofen 200mg  occasionally which helps a little bit.   Left Knee Bailey Patient with a history of a left lateral meniscus tear that was diagnosed via ultrasound about 3 months ago. Bailey is mostly located on the anterolateral aspect. No swelling. No locking, popping, or catching. Bailey is worse with injection. She received a steroid injection about 3 months ago which helped with the Bailey.  ROS: Per HPI  PMH: Smoking history reviewed.   Objective:  Physical Exam: BP (!) 140/95   Pulse 72   Ht 4\' 11"  (1.499 m)   Gen: NAD, resting comfortably MSK:  - R shoulder: No deformities. Tender to palpation over lateral aspect and over biceps tendon. FROM without any Bailey in all directions. 4+/5 with external rotation and supraspinatus testing.Strength otherwise 5/5. Empty can sign test positive. Speed's and Yergason's negative.  Neurovascularly intact distally.  -L Knee: No deformities. Tender to palpation over lateral joint line. FROM. Strength 5/5 throughout. Negative McMurrary.   Left Knee Injection After consent was obtained, using sterile technique the left knee was prepped with betadine. Topical ethyl chloride was used for topical anesthesia. A 3:1 solution of lidocaine 1% and depomedrol 40mg /cc was injected and the needle was withdrawn. The procedure was well tolerated.    Shoulder Injection Procedure Note  Pre-operative Diagnosis: right Rotator cuff tendinopathy   Post-operative  Diagnosis: same  Indications: Symptom relief.   Anesthesia: Topical ethyl chloride spray  Procedure Details   Verbal consent was obtained for the procedure. The shoulder was prepped with iodine and the skin was anesthetized. Using a 22 gauge needle the subacromial space was injected with 3 mL 1% lidocaine and 1 mL of depomedrol 40mg /cc under the posterior aspect of the acromion. The injection site was cleansed with topical isopropyl alcohol and a dressing was applied.  Complications:  None; patient tolerated the procedure well.  Assessment/Plan:  Bailey in joint, shoulder region Likely secondary to rotator cuff tendinopathy given weakness on physical exam. Subacromial injection performed today (see above progress note). She will follow up in 3-4 weeks for her knee. Continue ibuprofen as needed. Can see how injection worked at that time.   Tear of lateral meniscus of left knee Knee injection performed today (see above note). Discussed with patient if Bailey is not improving, she would likely require an MRI to confirm the diagnosis followed by referral to orthopedics for surgical intervention. Would not perform any further injections at this point. Continue using ibuprofen as needed for Bailey. Patient will follow up in 3-4 weeks.    Colleen Bailey. Colleen Bailey, Colleen Bailey 02/05/2016 11:57 AM    Patient seen and evaluated with the resident. I agree with the above plan of care. Patient is likely dealing with right shoulder rotator cuff tendinopathy or possibly a degenerative tear. Her subacromial space was injected today without complication. We will reevaluate her in 4 weeks. If symptoms persist we will need to get some imaging. In regards  to her left knee, previous x-rays showed only some mild degenerative changes. Previous ultrasound showed a meniscal tear. We will reinject her knee today and reevaluate in 4 weeks. If symptoms persist then we will need to consider MRI in  anticipation of possible arthroscopy.

## 2016-02-05 NOTE — Assessment & Plan Note (Signed)
Knee injection performed today (see above note). Discussed with patient if pain is not improving, she would likely require an MRI to confirm the diagnosis followed by referral to orthopedics for surgical intervention. Would not perform any further injections at this point. Continue using ibuprofen as needed for pain. Patient will follow up in 3-4 weeks.

## 2016-03-03 ENCOUNTER — Encounter: Payer: Managed Care, Other (non HMO) | Admitting: Internal Medicine

## 2016-03-10 ENCOUNTER — Ambulatory Visit: Payer: Managed Care, Other (non HMO) | Admitting: Sports Medicine

## 2016-03-17 ENCOUNTER — Telehealth: Payer: Self-pay | Admitting: Internal Medicine

## 2016-03-17 NOTE — Telephone Encounter (Signed)
APT. REMINDER CALL, LMTCB °

## 2016-03-18 ENCOUNTER — Encounter: Payer: Managed Care, Other (non HMO) | Admitting: Internal Medicine

## 2016-05-28 ENCOUNTER — Encounter: Payer: Self-pay | Admitting: Gynecology

## 2016-06-11 ENCOUNTER — Encounter: Payer: Self-pay | Admitting: Sports Medicine

## 2016-06-11 ENCOUNTER — Ambulatory Visit
Admission: RE | Admit: 2016-06-11 | Discharge: 2016-06-11 | Disposition: A | Discharge status: Home / Self Care | Payer: Managed Care, Other (non HMO) | Source: Ambulatory Visit | Attending: Sports Medicine | Admitting: Sports Medicine

## 2016-06-11 ENCOUNTER — Ambulatory Visit (INDEPENDENT_AMBULATORY_CARE_PROVIDER_SITE_OTHER): Payer: Managed Care, Other (non HMO) | Admitting: Sports Medicine

## 2016-06-11 VITALS — BP 122/84 | Ht 59.0 in | Wt 148.0 lb

## 2016-06-11 DIAGNOSIS — S83282D Other tear of lateral meniscus, current injury, left knee, subsequent encounter: Secondary | ICD-10-CM

## 2016-06-11 NOTE — Progress Notes (Signed)
  Colleen Bailey - 51 y.o. female MRN [id number]  Date of birth: [birth year]  SUBJECTIVE:  CC: Left knee pain  HPI: Colleen Bailey is a 51 y/o female coming in for left knee pain f/u visit. She has a lateral meniscus injury that has been treated with multiple injections (last on 02/05/16) previously. Her knee pain has returned for the past month. It is keeping her from performing her daily duties at work. She is unable to sleep at night due to pain. Her pain is worse with activity. She does not take any regular medications for it. Endorses use of knee sleeve and orthotics but with no improvement. There is no radiation, numbness, or tingling. Last knee X-ray was on July 2016 which was unremarkable.  ROS: No fevers, chills, or weight changes. Negative except per HPI  PHYSICAL EXAM:  VS: BP:122/84  HR: bpm  TEMP: ( )  RESP:   HT:4\' 11"  (149.9 cm)   WT:148 lb (67.1 kg)  BMI:30 PHYSICAL EXAM: General: Well appearing overweight female; alert and NAD L. Knee: No swelling or deformities; no effusion, TTP over lateral joint line; no tenderness medially,Strength 4/5 secondary to pain; mild left genu valgus noted; Normal ROM: Negative McMurrary test  ASSESSMENT & PLAN:  Colleen Bailey is a 51 y/o female with left mensicus tear.  Left meniscus tear She has been managed with multiple injections in the past and given that she has very minimal relief from it, we would need to explore possible surgical options. Her last knee X-ray was two years ago so will repeat an X-ray today with the possibility of obtaining MRI after that for further intervention. She was advised to continue wearing her sleeve for stability and will evaluate her for custom orthotics in future if necessary. -Left knee X-ray today -Left knee MRI if necessary  Addendum: X-rays reviewed. There is a paucity of degenerative change present. We will proceed with MRI specifically to rule out a lateral meniscal tear. Phone follow-up with those  results when available at which point we will delineate further treatment.

## 2016-06-13 NOTE — Addendum Note (Signed)
Addended by: Cyd Silence on: 06/13/2016 01:55 PM   Modules accepted: Orders

## 2016-06-22 ENCOUNTER — Ambulatory Visit
Admission: RE | Admit: 2016-06-22 | Discharge: 2016-06-22 | Disposition: A | Discharge status: Home / Self Care | Payer: Managed Care, Other (non HMO) | Source: Ambulatory Visit | Attending: Sports Medicine | Admitting: Sports Medicine

## 2016-06-22 DIAGNOSIS — S83282D Other tear of lateral meniscus, current injury, left knee, subsequent encounter: Secondary | ICD-10-CM

## 2016-06-24 ENCOUNTER — Telehealth: Payer: Self-pay | Admitting: Sports Medicine

## 2016-06-24 NOTE — Telephone Encounter (Signed)
I spoke with the patient on the phone today after reviewing MRI findings of her left knee. Dominant finding is a degenerated and torn lateral meniscus. 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 She also has a probable stress reaction in the lateral tibial plateau medially as a result of her meniscal tear. This is a chronic problem for her. She has had several cortisone injections in the past, each of which helped her temporarily. I recommended referral to Dr. Percell Miller to discuss merits of arthroscopy. I will defer further workup and treatment to his discretion. Patient will follow-up with me as needed.

## 2016-06-24 NOTE — Telephone Encounter (Signed)
[facility] [address] [place] [address] [place] [address]  [phone] Wednesday 07/02/16 at 315p Arrival time is 3p

## 2016-09-10 ENCOUNTER — Other Ambulatory Visit: Payer: Self-pay | Admitting: Physician Assistant

## 2016-09-10 DIAGNOSIS — Z1231 Encounter for screening mammogram for malignant neoplasm of breast: Secondary | ICD-10-CM

## 2016-09-11 ENCOUNTER — Ambulatory Visit
Admission: RE | Admit: 2016-09-11 | Discharge: 2016-09-11 | Disposition: A | Discharge status: Home / Self Care | Payer: Managed Care, Other (non HMO) | Source: Ambulatory Visit | Attending: Physician Assistant | Admitting: Physician Assistant

## 2016-09-11 DIAGNOSIS — Z1231 Encounter for screening mammogram for malignant neoplasm of breast: Secondary | ICD-10-CM

## 2016-11-26 ENCOUNTER — Encounter: Payer: Managed Care, Other (non HMO) | Admitting: Obstetrics & Gynecology

## 2016-11-27 ENCOUNTER — Encounter: Payer: Managed Care, Other (non HMO) | Admitting: Obstetrics & Gynecology

## 2017-01-23 ENCOUNTER — Ambulatory Visit (INDEPENDENT_AMBULATORY_CARE_PROVIDER_SITE_OTHER): Payer: Managed Care, Other (non HMO) | Admitting: Obstetrics & Gynecology

## 2017-01-23 ENCOUNTER — Encounter: Payer: Self-pay | Admitting: Obstetrics & Gynecology

## 2017-01-23 VITALS — BP 128/76 | Ht <= 58 in | Wt 146.0 lb

## 2017-01-23 DIAGNOSIS — Z01419 Encounter for gynecological examination (general) (routine) without abnormal findings: Secondary | ICD-10-CM

## 2017-01-23 DIAGNOSIS — Z1151 Encounter for screening for human papillomavirus (HPV): Secondary | ICD-10-CM

## 2017-01-23 DIAGNOSIS — N911 Secondary amenorrhea: Secondary | ICD-10-CM | POA: Diagnosis not present

## 2017-01-23 DIAGNOSIS — Z9851 Tubal ligation status: Secondary | ICD-10-CM | POA: Diagnosis not present

## 2017-01-23 NOTE — Progress Notes (Signed)
Colleen Bailey 01-Feb-1965 [id number]   History:    52 y.o. [id number]  Married.  Tubal Ligation  RP:  Established patient presenting for annual gyn exam   HPI:  No menses x about 6 menses.  No vaginal spotting.  No pelvic pain.  No hot flushes, no night sweats.  No pain with IC.  Breasts wnl.  Urine and bowel movements normal.  Health labs with family physician.  Past medical history,surgical history, family history and social history were all reviewed and documented in the EPIC chart.  Gynecologic History No LMP recorded. Patient is not currently having periods (Reason: Other). Contraception: tubal ligation Last Pap: 2015. Results were: normal Last mammogram: 08/2016. Results were: Negative Tried to do a screening Colonoscopy, but didn't tolerated the bowel prep.  Will call back Fam MD to reattempt or do Cologard  Obstetric History OB History  Gravida Para Term Preterm AB Living  5 3     2 3   SAB TAB Ectopic Multiple Live Births    2          # Outcome Date GA Lbr Len/2nd Weight Sex Delivery Anes PTL Lv  5 TAB           4 TAB           3 Para           2 Para           1 Para                ROS: A ROS was performed and pertinent positives and negatives are included in the history.  GENERAL: No fevers or chills. HEENT: No change in vision, no earache, sore throat or sinus congestion. NECK: No pain or stiffness. CARDIOVASCULAR: No chest pain or pressure. No palpitations. PULMONARY: No shortness of breath, cough or wheeze. GASTROINTESTINAL: No abdominal pain, nausea, vomiting or diarrhea, melena or bright red blood per rectum. GENITOURINARY: No urinary frequency, urgency, hesitancy or dysuria. MUSCULOSKELETAL: No joint or muscle pain, no back pain, no recent trauma. DERMATOLOGIC: No rash, no itching, no lesions. ENDOCRINE: No polyuria, polydipsia, no heat or cold intolerance. No recent change in weight. HEMATOLOGICAL: No anemia or easy bruising or bleeding. NEUROLOGIC: No  headache, seizures, numbness, tingling or weakness. PSYCHIATRIC: No depression, no loss of interest in normal activity or change in sleep pattern.     Exam:   BP 128/76   Ht 4\' 10"  (1.473 m)   Wt 146 lb (66.2 kg)   BMI 30.51 kg/m   Body mass index is 30.51 kg/m.  General appearance : Well developed well nourished female. No acute distress HEENT: Eyes: no retinal hemorrhage or exudates,  Neck supple, trachea midline, no carotid bruits, no thyroidmegaly Lungs: Clear to auscultation, no rhonchi or wheezes, or rib retractions  Heart: Regular rate and rhythm, no murmurs or gallops Breast:Examined in sitting and supine position were symmetrical in appearance, no palpable masses or tenderness,  no skin retraction, no nipple inversion, no nipple discharge, no skin discoloration, no axillary or supraclavicular lymphadenopathy Abdomen: no palpable masses or tenderness, no rebound or guarding Extremities: no edema or skin discoloration or tenderness  Pelvic:  Vulva normal  Bartholin, Urethra, Skene Glands: Within normal limits             Vagina: No gross lesions or discharge  Cervix: No gross lesions or discharge.  Pap/HPV HR done.  Uterus  AV, normal size, shape and consistency, non-tender and mobile  Adnexa  Without masses or tenderness  Anus and perineum  normal    Assessment/Plan:  52 y.o. female for annual exam   1. Encounter for routine gynecological examination with Papanicolaou smear of cervix Normal gynecologic exam.  Pap test with high risk HPV done.  Breast exam normal.  Screening mammogram negative August 2018.  Health labs with family physician.  Will contact family physician to reorganize a colonoscopy with a different bowel prep or to do a Cologard test.  2. Secondary amenorrhea Probable menopause or perimenopause.  Will assess with [facility] today.  If not in menopause, will do a withdrawal progestin test and bring in for a pelvic ultrasound to assess the endometrial line. -  FSH  3. Tubal ligation status  Counseling on above issues more than 50% for 10 minutes.  Princess Bruins MD, 3:52 PM 01/23/2017

## 2017-01-23 NOTE — Patient Instructions (Signed)
1. Encounter for routine gynecological examination with Papanicolaou smear of cervix Normal gynecologic exam.  Pap test with high risk HPV done.  Breast exam normal.  Screening mammogram negative August 2018.  Health labs with family physician.  Will contact family physician to reorganize a colonoscopy with a different bowel prep or to do a Cologard test.  2. Secondary amenorrhea Probable menopause or perimenopause.  Will assess with [facility] today.  If not in menopause, will do a withdrawal progestin test and bring in for a pelvic ultrasound to assess the endometrial line. - [facility]  3. Tubal ligation status   Colleen Bailey, it was a pleasure meeting you today!  I will inform you of your results as soon as they are available.  Health Maintenance, Female Adopting a healthy lifestyle and getting preventive care can go a long way to promote health and wellness. Talk with your health care provider about what schedule of regular examinations is right for you. This is a good chance for you to check in with your provider about disease prevention and staying healthy. In between checkups, there are plenty of things you can do on your own. Experts have done a lot of research about which lifestyle changes and preventive measures are most likely to keep you healthy. Ask your health care provider for more information. Weight and diet Eat a healthy diet  Be sure to include plenty of vegetables, fruits, low-fat dairy products, and lean protein.  Do not eat a lot of foods high in solid fats, added sugars, or salt.  Get regular exercise. This is one of the most important things you can do for your health. ? Most adults should exercise for at least 150 minutes each week. The exercise should increase your heart rate and make you sweat (moderate-intensity exercise). ? Most adults should also do strengthening exercises at least twice a week. This is in addition to the moderate-intensity exercise.  Maintain a healthy  weight  Body mass index (BMI) is a measurement that can be used to identify possible weight problems. It estimates body fat based on height and weight. Your health care provider can help determine your BMI and help you achieve or maintain a healthy weight.  For females 40 years of age and older: ? A BMI below 18.5 is considered underweight. ? A BMI of 18.5 to 24.9 is normal. ? A BMI of 25 to 29.9 is considered overweight. ? A BMI of 30 and above is considered obese.  Watch levels of cholesterol and blood lipids  You should start having your blood tested for lipids and cholesterol at 51 years of age, then have this test every 5 years.  You may need to have your cholesterol levels checked more often if: ? Your lipid or cholesterol levels are high. ? You are older than 50 years of age. ? You are at high risk for heart disease.  Cancer screening Lung Cancer  Lung cancer screening is recommended for adults 42-31 years old who are at high risk for lung cancer because of a history of smoking.  A yearly low-dose CT scan of the lungs is recommended for people who: ? Currently smoke. ? Have quit within the past 15 years. ? Have at least a 30-pack-year history of smoking. A pack year is smoking an average of one pack of cigarettes a day for 1 year.  Yearly screening should continue until it has been 15 years since you quit.  Yearly screening should stop if you develop a health  problem that would prevent you from having lung cancer treatment.  Breast Cancer  Practice breast self-awareness. This means understanding how your breasts normally appear and feel.  It also means doing regular breast self-exams. Let your health care provider know about any changes, no matter how small.  If you are in your 20s or 30s, you should have a clinical breast exam (CBE) by a health care provider every 1-3 years as part of a regular health exam.  If you are 60 or older, have a CBE every year. Also consider  having a breast X-ray (mammogram) every year.  If you have a family history of breast cancer, talk to your health care provider about genetic screening.  If you are at high risk for breast cancer, talk to your health care provider about having an MRI and a mammogram every year.  Breast cancer gene (BRCA) assessment is recommended for women who have family members with BRCA-related cancers. BRCA-related cancers include: ? Breast. ? Ovarian. ? Tubal. ? Peritoneal cancers.  Results of the assessment will determine the need for genetic counseling and BRCA1 and BRCA2 testing.  Cervical Cancer Your health care provider may recommend that you be screened regularly for cancer of the pelvic organs (ovaries, uterus, and vagina). This screening involves a pelvic examination, including checking for microscopic changes to the surface of your cervix (Pap test). You may be encouraged to have this screening done every 3 years, beginning at age 50.  For women ages 42-65, health care providers may recommend pelvic exams and Pap testing every 3 years, or they may recommend the Pap and pelvic exam, combined with testing for human papilloma virus (HPV), every 5 years. Some types of HPV increase your risk of cervical cancer. Testing for HPV may also be done on women of any age with unclear Pap test results.  Other health care providers may not recommend any screening for nonpregnant women who are considered low risk for pelvic cancer and who do not have symptoms. Ask your health care provider if a screening pelvic exam is right for you.  If you have had past treatment for cervical cancer or a condition that could lead to cancer, you need Pap tests and screening for cancer for at least 20 years after your treatment. If Pap tests have been discontinued, your risk factors (such as having a new sexual partner) need to be reassessed to determine if screening should resume. Some women have medical problems that increase  the chance of getting cervical cancer. In these cases, your health care provider may recommend more frequent screening and Pap tests.  Colorectal Cancer  This type of cancer can be detected and often prevented.  Routine colorectal cancer screening usually begins at 51 years of age and continues through 51 years of age.  Your health care provider may recommend screening at an earlier age if you have risk factors for colon cancer.  Your health care provider may also recommend using home test kits to check for hidden blood in the stool.  A small camera at the end of a tube can be used to examine your colon directly (sigmoidoscopy or colonoscopy). This is done to check for the earliest forms of colorectal cancer.  Routine screening usually begins at age 30.  Direct examination of the colon should be repeated every 5-10 years through 51 years of age. However, you may need to be screened more often if early forms of precancerous polyps or small growths are found.  Skin Cancer  Check your skin from head to toe regularly.  Tell your health care provider about any new moles or changes in moles, especially if there is a change in a mole's shape or color.  Also tell your health care provider if you have a mole that is larger than the size of a pencil eraser.  Always use sunscreen. Apply sunscreen liberally and repeatedly throughout the day.  Protect yourself by wearing long sleeves, pants, a wide-brimmed hat, and sunglasses whenever you are outside.  Heart disease, diabetes, and high blood pressure  High blood pressure causes heart disease and increases the risk of stroke. High blood pressure is more likely to develop in: ? People who have blood pressure in the high end of the normal range (130-139/85-89 mm Hg). ? People who are overweight or obese. ? People who are African American.  If you are 18-39 years of age, have your blood pressure checked every 3-5 years. If you are 40 years of age  or older, have your blood pressure checked every year. You should have your blood pressure measured twice-once when you are at a hospital or clinic, and once when you are not at a hospital or clinic. Record the average of the two measurements. To check your blood pressure when you are not at a hospital or clinic, you can use: ? An automated blood pressure machine at a pharmacy. ? A home blood pressure monitor.  If you are between 55 years and 79 years old, ask your health care provider if you should take aspirin to prevent strokes.  Have regular diabetes screenings. This involves taking a blood sample to check your fasting blood sugar level. ? If you are at a normal weight and have a low risk for diabetes, have this test once every three years after 51 years of age. ? If you are overweight and have a high risk for diabetes, consider being tested at a younger age or more often. Preventing infection Hepatitis B  If you have a higher risk for hepatitis B, you should be screened for this virus. You are considered at high risk for hepatitis B if: ? You were born in a country where hepatitis B is common. Ask your health care provider which countries are considered high risk. ? Your parents were born in a high-risk country, and you have not been immunized against hepatitis B (hepatitis B vaccine). ? You have HIV or AIDS. ? You use needles to inject street drugs. ? You live with someone who has hepatitis B. ? You have had sex with someone who has hepatitis B. ? You get hemodialysis treatment. ? You take certain medicines for conditions, including cancer, organ transplantation, and autoimmune conditions.  Hepatitis C  Blood testing is recommended for: ? Everyone born from 1945 through 1965. ? Anyone with known risk factors for hepatitis C.  Sexually transmitted infections (STIs)  You should be screened for sexually transmitted infections (STIs) including gonorrhea and chlamydia if: ? You are  sexually active and are younger than 51 years of age. ? You are older than 51 years of age and your health care provider tells you that you are at risk for this type of infection. ? Your sexual activity has changed since you were last screened and you are at an increased risk for chlamydia or gonorrhea. Ask your health care provider if you are at risk.  If you do not have HIV, but are at risk, it may be recommended that you take a prescription   medicine daily to prevent HIV infection. This is called pre-exposure prophylaxis (PrEP). You are considered at risk if: ? You are sexually active and do not regularly use condoms or know the HIV status of your partner(s). ? You take drugs by injection. ? You are sexually active with a partner who has HIV.  Talk with your health care provider about whether you are at high risk of being infected with HIV. If you choose to begin PrEP, you should first be tested for HIV. You should then be tested every 3 months for as long as you are taking PrEP. Pregnancy  If you are premenopausal and you may become pregnant, ask your health care provider about preconception counseling.  If you may become pregnant, take 400 to 800 micrograms (mcg) of folic acid every day.  If you want to prevent pregnancy, talk to your health care provider about birth control (contraception). Osteoporosis and menopause  Osteoporosis is a disease in which the bones lose minerals and strength with aging. This can result in serious bone fractures. Your risk for osteoporosis can be identified using a bone density scan.  If you are 26 years of age or older, or if you are at risk for osteoporosis and fractures, ask your health care provider if you should be screened.  Ask your health care provider whether you should take a calcium or vitamin D supplement to lower your risk for osteoporosis.  Menopause may have certain physical symptoms and risks.  Hormone replacement therapy may reduce some  of these symptoms and risks. Talk to your health care provider about whether hormone replacement therapy is right for you. Follow these instructions at home:  Schedule regular health, dental, and eye exams.  Stay current with your immunizations.  Do not use any tobacco products including cigarettes, chewing tobacco, or electronic cigarettes.  If you are pregnant, do not drink alcohol.  If you are breastfeeding, limit how much and how often you drink alcohol.  Limit alcohol intake to no more than 1 drink per day for nonpregnant women. One drink equals 12 ounces of beer, 5 ounces of wine, or 1 ounces of hard liquor.  Do not use street drugs.  Do not share needles.  Ask your health care provider for help if you need support or information about quitting drugs.  Tell your health care provider if you often feel depressed.  Tell your health care provider if you have ever been abused or do not feel safe at home. This information is not intended to replace advice given to you by your health care provider. Make sure you discuss any questions you have with your health care provider. Document Released: 07/15/2010 Document Revised: 06/07/2015 Document Reviewed: 10/03/2014 Elsevier Interactive Patient Education  [employer].

## 2017-01-23 NOTE — Addendum Note (Signed)
Addended by: Thurnell Garbe A on: 01/23/2017 04:33 PM   Modules accepted: Orders

## 2017-01-24 LAB — FOLLICLE STIMULATING HORMONE: FSH: 49.8 m[IU]/mL

## 2017-01-27 LAB — PAP, TP IMAGING W/ HPV RNA, RFLX HPV TYPE 16,18/45: HPV DNA HIGH RISK: NOT DETECTED

## 2017-07-17 ENCOUNTER — Ambulatory Visit (INDEPENDENT_AMBULATORY_CARE_PROVIDER_SITE_OTHER): Payer: Managed Care, Other (non HMO) | Admitting: Family Medicine

## 2017-07-17 ENCOUNTER — Encounter: Payer: Self-pay | Admitting: Family Medicine

## 2017-07-17 VITALS — BP 152/92 | Ht 59.0 in | Wt 138.0 lb

## 2017-07-17 DIAGNOSIS — M545 Low back pain, unspecified: Secondary | ICD-10-CM

## 2017-07-17 DIAGNOSIS — M25511 Pain in right shoulder: Secondary | ICD-10-CM

## 2017-07-17 DIAGNOSIS — M25512 Pain in left shoulder: Secondary | ICD-10-CM | POA: Diagnosis not present

## 2017-07-17 MED ORDER — IBUPROFEN 600 MG PO TABS: 600.0000 mg | ORAL_TABLET | Freq: Three times a day (TID) | ORAL | 1 refills | Status: DC | PRN

## 2017-07-17 MED ORDER — METHYLPREDNISOLONE ACETATE 40 MG/ML IJ SUSP
40.0000 mg | Freq: Once | INTRAMUSCULAR | Status: AC
  Administered 2017-07-17: 40 mg via INTRA_ARTICULAR

## 2017-07-17 NOTE — Progress Notes (Signed)
  Colleen Bailey - 52 y.o. female MRN [id number]  Date of birth: [birth year]    SUBJECTIVE:      Chief Complaint:/ HPI:  Bilateral right greater than left shoulder pain.  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  This causes her shoulder to ache.  Has had this problem several years.  About a year ago I gave her an injection which helped for quite some time.  In the last month or so the pain is gotten significantly worse and she is having difficulty sleeping at night, having pain with reaching overhead particularly.  Left shoulder is also starting to ache in the same fashion but is 2 out of 10 where the right shoulder is 10 out of 10.  She is right-hand dominant.   ROS:     No fever.  No numbness or tingling in her hands.  No upper extremity weakness.  PERTINENT  PMH / PSH FH / / SH:  Past Medical, Surgical, Social, and Family History Reviewed & Updated in the EMR.  Pertinent findings include:  No history of shoulder surgery.  OBJECTIVE: BP (!) 152/92   Ht 4\' 11"  (1.499 m)   Wt 138 lb (62.6 kg)   BMI 27.87 kg/m   Physical Exam:  Vital signs are reviewed. GENERAL: Well-developed female no acute distress Shoulder: Right full range of motion and intact strength in all planes the rotator cuff.  She has significant pain with forward flexion above 90 degrees and abduction above 90 degrees.  No bicipital tendon tenderness to palpation. neuro: Intact sensation to soft touch bilateral hands VASCULAR: Radial pulses 2+ bilateral symmetrical Skin: Area around the right shoulder both anteriorly and posteriorly is without any sign of ecchymosis, no rash, no unusual warmth or erythema.  PROCEDURE: INJECTION: Patient was given informed consent, signed copy in the chart. Appropriate time out was taken. Area prepped and draped in usual sterile fashion. Ethyl chloride was  used for local anesthesia. A 21 gauge 1 1/2 inch needle was used.. 1 cc of  methylprednisolone 40 mg/ml plus 4 cc of 1% lidocaine without epinephrine was injected into the right subacromial bursa using a(n) posterior approach.   The patient tolerated the procedure well. There were no complications. Post procedure instructions were given.   ASSESSMENT & PLAN: I refilled her ibuprofen which was originally prescribed for some low back pain for which she has been using for shoulder pain.  Discussed red flags such as gastric upset, tarry stools, abdominal pain etc. See problem based charting & AVS for pt instructions.

## 2017-07-17 NOTE — Assessment & Plan Note (Signed)
Chronic right subacromial bursitis/rotator cuff syndrome.  Overuse with her current job.  Corticosteroid injection today.  She is having some issues with the left shoulder but I did not want to inject that today.  We will see how it does with hopefully taking some stress off the left shoulder when the right shoulder returns to baseline left shoulder still bothers her in 3 to 4 weeks, she can return to clinic for further evaluation and consideration of subacromial bursa injection on the left.  Her strength is fine so I do not know the home exercise program will help her.  Continues to have issues, with being at acromial anatomy see if she needs a decompression.

## 2018-01-25 ENCOUNTER — Encounter: Payer: Managed Care, Other (non HMO) | Admitting: Obstetrics & Gynecology

## 2018-02-12 ENCOUNTER — Other Ambulatory Visit: Payer: Self-pay

## 2018-02-12 ENCOUNTER — Other Ambulatory Visit: Payer: Self-pay | Admitting: Physician Assistant

## 2018-02-12 DIAGNOSIS — Z1231 Encounter for screening mammogram for malignant neoplasm of breast: Secondary | ICD-10-CM

## 2018-03-12 ENCOUNTER — Ambulatory Visit
Admission: RE | Admit: 2018-03-12 | Discharge: 2018-03-12 | Disposition: A | Discharge status: Home / Self Care | Payer: Managed Care, Other (non HMO) | Source: Ambulatory Visit | Attending: Physician Assistant | Admitting: Physician Assistant

## 2018-03-12 DIAGNOSIS — Z1231 Encounter for screening mammogram for malignant neoplasm of breast: Secondary | ICD-10-CM

## 2018-03-16 ENCOUNTER — Other Ambulatory Visit: Payer: Self-pay | Admitting: Physician Assistant

## 2018-03-16 DIAGNOSIS — R928 Other abnormal and inconclusive findings on diagnostic imaging of breast: Secondary | ICD-10-CM

## 2018-03-22 ENCOUNTER — Ambulatory Visit
Admission: RE | Admit: 2018-03-22 | Discharge: 2018-03-22 | Disposition: A | Discharge status: Home / Self Care | Payer: Managed Care, Other (non HMO) | Source: Ambulatory Visit | Attending: Physician Assistant | Admitting: Physician Assistant

## 2018-03-22 ENCOUNTER — Ambulatory Visit: Payer: Managed Care, Other (non HMO)

## 2018-03-22 DIAGNOSIS — R928 Other abnormal and inconclusive findings on diagnostic imaging of breast: Secondary | ICD-10-CM

## 2018-04-13 ENCOUNTER — Encounter: Payer: Managed Care, Other (non HMO) | Admitting: Obstetrics & Gynecology

## 2018-06-11 ENCOUNTER — Encounter: Payer: Managed Care, Other (non HMO) | Admitting: Obstetrics & Gynecology

## 2018-06-21 ENCOUNTER — Encounter: Payer: Managed Care, Other (non HMO) | Admitting: Obstetrics & Gynecology

## 2018-11-10 ENCOUNTER — Other Ambulatory Visit: Payer: Self-pay

## 2018-11-10 ENCOUNTER — Emergency Department (HOSPITAL_COMMUNITY): Payer: Managed Care, Other (non HMO)

## 2018-11-10 ENCOUNTER — Emergency Department (HOSPITAL_COMMUNITY)
Admission: EM | Admit: 2018-11-10 | Discharge: 2018-11-10 | Disposition: A | Discharge status: Home / Self Care | Payer: Managed Care, Other (non HMO) | Attending: Emergency Medicine | Admitting: Emergency Medicine

## 2018-11-10 DIAGNOSIS — R42 Dizziness and giddiness: Secondary | ICD-10-CM | POA: Insufficient documentation

## 2018-11-10 LAB — CBC
HCT: 39 % (ref 36.0–46.0)
Hemoglobin: 12.7 g/dL (ref 12.0–15.0)
MCH: 25.8 pg — ABNORMAL LOW (ref 26.0–34.0)
MCHC: 32.6 g/dL (ref 30.0–36.0)
MCV: 79.3 fL — ABNORMAL LOW (ref 80.0–100.0)
Platelets: 307 10*3/uL (ref 150–400)
RBC: 4.92 MIL/uL (ref 3.87–5.11)
RDW: 12.5 % (ref 11.5–15.5)
WBC: 6.6 10*3/uL (ref 4.0–10.5)
nRBC: 0 % (ref 0.0–0.2)

## 2018-11-10 LAB — BASIC METABOLIC PANEL
Anion gap: 10 (ref 5–15)
BUN: 12 mg/dL (ref 6–20)
CO2: 24 mmol/L (ref 22–32)
Calcium: 9 mg/dL (ref 8.9–10.3)
Chloride: 104 mmol/L (ref 98–111)
Creatinine, Ser: 0.69 mg/dL (ref 0.44–1.00)
GFR calc Af Amer: >60 mL/min (ref >60)
GFR calc non Af Amer: >60 mL/min (ref >60)
Glucose, Bld: 138 mg/dL — ABNORMAL HIGH (ref 70–99)
Potassium: 4 mmol/L (ref 3.5–5.1)
Sodium: 138 mmol/L (ref 135–145)

## 2018-11-10 LAB — I-STAT BETA HCG BLOOD, ED (MC, WL, AP ONLY): I-stat hCG, quantitative: <5 m[IU]/mL (ref <5)

## 2018-11-10 LAB — TROPONIN I (HIGH SENSITIVITY): Troponin I (High Sensitivity): <2 ng/L (ref <18)

## 2018-11-10 MED ORDER — MECLIZINE HCL 25 MG PO TABS: 50.0000 mg | ORAL_TABLET | Freq: Three times a day (TID) | ORAL | 0 refills | Status: DC | PRN

## 2018-11-10 MED ORDER — MECLIZINE HCL 25 MG PO TABS
50.0000 mg | ORAL_TABLET | Freq: Once | ORAL | Status: AC
  Administered 2018-11-10: 50 mg via ORAL
  Filled 2018-11-10: qty 2

## 2018-11-10 NOTE — ED Triage Notes (Signed)
Pt endorses on and off dizziness "for years" Woke up this morning at 0800 and it was worse. Has hx of vertigo but has never taken medication for it. VSS. No stroke sx.

## 2018-11-10 NOTE — ED Provider Notes (Signed)
[place] EMERGENCY DEPARTMENT Provider Note   CSN: [id number] Arrival date & time: 11/10/18  [id number]     History   Chief Complaint Chief Complaint  Patient presents with  . Dizziness    HPI Colleen Bailey is a 53 y.o. female.     HPI   53 year old female presents today with complaints of dizziness.  Patient notes a several year history of intermittent dizziness.  She notes this comes on out of the blue, sometimes aggravated by movements.  She notes that she feels as if the world is spinning around her.  Patient notes she had an episode this morning.  She woke up in bed had no dizziness, she got up to you walk and felt the room spinning.  This went away completely with no lingering symptoms, and then returned shortly after.  She notes right now she has very minimal dizziness, no severe symptoms.  She denies any associated neurological deficits with this, she denies any chest pain or shortness of breath.  She is not been evaluated for this previously.  Past Medical History:  Diagnosis Date  . Anemia 12/27/2012  . Elevated BP 03/07/2013  . Hyperlipidemia     Patient Active Problem List   Diagnosis Date Noted  . Perimenopause 11/26/2015  . Tear of lateral meniscus of left knee 08/02/2015  . Painful patella 06/09/2014  . Combined abdominal and pelvic pain 11/11/2013  . Intramural leiomyoma of uterus 11/11/2013  . Bilateral shoulder pain 08/25/2013  . Pain in joint, shoulder region 05/26/2013  . Chronic cholecystitis with calculus 03/18/2013  . Elevated BP 03/07/2013  . Dizziness and giddiness 03/07/2013  . Annual physical exam 12/27/2012  . Anemia 12/27/2012    Past Surgical History:  Procedure Laterality Date  . CESAREAN SECTION     X2  . CHOLECYSTECTOMY  05-05-2013  . LITHOTRIPSY    . TUBAL LIGATION       OB History    Gravida  5   Para  3   Term      Preterm      AB  2   Living  3     SAB      TAB  2   Ectopic      Multiple       Live Births               Home Medications    Prior to Admission medications   Medication Sig Start Date End Date Taking? Authorizing Provider  ibuprofen (ADVIL,MOTRIN) 600 MG tablet Take 1 tablet (600 mg total) by mouth every 8 (eight) hours as needed. 07/17/17   Dickie La, MD  meclizine (ANTIVERT) 25 MG tablet Take 2 tablets (50 mg total) by mouth 3 (three) times daily as needed for dizziness. 11/10/18   Okey Regal, PA-C    Family History Family History  Problem Relation Age of Onset  . Diabetes Father   . Cancer Neg Hx   . CAD Neg Hx   . Breast cancer Neg Hx     Social History Social History   Tobacco Use  . Smoking status: Never Smoker  . Smokeless tobacco: Never Used  Substance Use Topics  . Alcohol use: No    Alcohol/week: 0.0 standard drinks    Comment: rare  . Drug use: No     Allergies   Patient has no known allergies.   Review of Systems Review of Systems  All other systems reviewed and are negative.  Physical Exam Updated Vital Signs BP 137/86 (BP Location: Right Arm)   Pulse 79   Temp 98.5 F (36.9 C) (Oral)   Resp (!) 21   LMP 10/23/2015   SpO2 96%   Physical Exam Vitals signs and nursing note reviewed.  Constitutional:      Appearance: She is well-developed.  HENT:     Head: Normocephalic and atraumatic.  Eyes:     General: No scleral icterus.       Right eye: No discharge.        Left eye: No discharge.     Conjunctiva/sclera: Conjunctivae normal.     Pupils: Pupils are equal, round, and reactive to light.  Neck:     Musculoskeletal: Normal range of motion.     Vascular: No JVD.     Trachea: No tracheal deviation.  Cardiovascular:     Rate and Rhythm: Normal rate and regular rhythm.  Pulmonary:     Effort: Pulmonary effort is normal.     Breath sounds: Normal breath sounds. No stridor.  Neurological:     General: No focal deficit present.     Mental Status: She is alert and oriented to person, place, and  time.     Cranial Nerves: No cranial nerve deficit.     Sensory: No sensory deficit.     Motor: No weakness.     Coordination: Coordination normal.     Gait: Gait normal.     Comments: Ambulates with steady gait  Psychiatric:        Behavior: Behavior normal.        Thought Content: Thought content normal.        Judgment: Judgment normal.      ED Treatments / Results  Labs (all labs ordered are listed, but only abnormal results are displayed) Labs Reviewed  BASIC METABOLIC PANEL - Abnormal; Notable for the following components:      Result Value   Glucose, Bld 138 (*)    All other components within normal limits  CBC - Abnormal; Notable for the following components:   MCV 79.3 (*)    MCH 25.8 (*)    All other components within normal limits  I-STAT BETA HCG BLOOD, ED ([facility], [facility], [facility] ONLY)  TROPONIN I (HIGH SENSITIVITY)    EKG EKG Interpretation  Date/Time:  Wednesday November 10 2018 08:46:56 EDT Ventricular Rate:  81 PR Interval:  164 QRS Duration: 76 QT Interval:  376 QTC Calculation: 436 R Axis:   40 Text Interpretation: Normal sinus rhythm Normal ECG Confirmed by Orpah Greek [phone]) on 11/11/2018 7:46:50 AM   Radiology No results found.  Procedures Procedures (including critical care time)  Medications Ordered in ED Medications  meclizine (ANTIVERT) tablet 50 mg (50 mg Oral Given 11/10/18 1742)     Initial Impression / Assessment and Plan / ED Course  I have reviewed the triage vital signs and the nursing notes.  Pertinent labs & imaging results that were available during my care of the patient were reviewed by me and considered in my medical decision making (see chart for details).         53 year old female presents today with dizziness.  She has had several years of this this is unchanged years slightly more severe today.  This does seem positional, she has no signs of orthostatic related dizziness, no indication for intracranial  abnormality.  This is likely peripheral vertigo.  Patient given meclizine here, discharged with outpatient follow-up and strict return precautions.  She verbalized understanding and agreement to today's plan had no further questions or concerns at the time of discharge.  Final Clinical Impressions(s) / ED Diagnoses   Final diagnoses:  Vertigo    ED Discharge Orders         Ordered    meclizine (ANTIVERT) 25 MG tablet  3 times daily PRN     11/10/18 1904           Okey Regal, PA-C 11/15/18 1024    Tegeler, Gwenyth Allegra, MD 11/15/18 1101

## 2018-11-10 NOTE — Discharge Instructions (Signed)
Please read attached information. If you experience any new or worsening signs or symptoms please return to the emergency room for evaluation. Please follow-up with your primary care provider or specialist as discussed. Please use medication prescribed only as directed and discontinue taking if you have any concerning signs or symptoms.   °

## 2018-11-10 NOTE — ED Notes (Signed)
Patient verbalizes understanding of discharge instructions. Opportunity for questioning and answers were provided. Pt discharged from ED. 

## 2019-06-17 ENCOUNTER — Ambulatory Visit (INDEPENDENT_AMBULATORY_CARE_PROVIDER_SITE_OTHER): Payer: Managed Care, Other (non HMO) | Admitting: Plastic Surgery

## 2019-06-17 ENCOUNTER — Other Ambulatory Visit: Payer: Self-pay | Admitting: Plastic Surgery

## 2019-06-17 ENCOUNTER — Other Ambulatory Visit: Payer: Self-pay

## 2019-06-17 ENCOUNTER — Encounter: Payer: Self-pay | Admitting: Plastic Surgery

## 2019-06-17 DIAGNOSIS — M549 Dorsalgia, unspecified: Secondary | ICD-10-CM | POA: Insufficient documentation

## 2019-06-17 DIAGNOSIS — M542 Cervicalgia: Secondary | ICD-10-CM | POA: Diagnosis not present

## 2019-06-17 DIAGNOSIS — M546 Pain in thoracic spine: Secondary | ICD-10-CM

## 2019-06-17 DIAGNOSIS — N62 Hypertrophy of breast: Secondary | ICD-10-CM

## 2019-06-17 DIAGNOSIS — G8929 Other chronic pain: Secondary | ICD-10-CM

## 2019-06-17 NOTE — Progress Notes (Signed)
Patient ID: Colleen Bailey, female    DOB: 1965-12-26, 54 y.o.   MRN: [id number]   Chief Complaint  Patient presents with  . Consult    Breast reduction  . Breast Problem    Mammary Hyperplasia: The patient is a 54 y.o. female with a history of mammary hyperplasia for several years.  She has extremely large breasts causing symptoms that include the following: Back pain in the upper and lower back, including neck pain. She pulls or pins her bra straps to provide better lift and relief of the pressure and pain. She notices relief by holding her breast up manually.  Her shoulder straps cause grooves and pain and pressure that requires padding for relief. Pain medication is sometimes required with motrin and tylenol.  Activities that are hindered by enlarged breasts include: exercise and running.   Her breasts are extremely large and fairly symmetric, the right side is slightly larger.  She has hyperpigmentation of the inframammary area on both sides.  The sternal to nipple distance on the right is 33 cm and the left is 31 cm.  The IMF distance is 14 cm.  She is 4 feet 11 inches tall and weighs 145 pounds.  Preoperative bra size =40-42 DDD cup.  She would like to be around a B or C.  The estimated excess breast tissue to be removed at the time of surgery = 350 grams on the left and 350 grams on the right.  Mammogram history: 2019 the mammogram was negative.  The patient had a C-section, cholecystectomy and tonsillectomy. She has not had physical therapy in the past.  She is not a smoker.    Review of Systems  Constitutional: Positive for activity change. Negative for appetite change.  HENT: Negative.   Eyes: Negative.   Respiratory: Negative.  Negative for chest tightness and shortness of breath.   Cardiovascular: Negative.   Gastrointestinal: Negative.   Endocrine: Negative.   Genitourinary: Negative.   Musculoskeletal: Positive for back pain and neck pain.  Skin: Positive for rash.    Neurological: Negative.   Hematological: Negative.   Psychiatric/Behavioral: Negative.     History reviewed. No pertinent past medical history.  History reviewed. No pertinent surgical history.   No current outpatient medications on file.   Objective:   Vitals:   06/17/19 1240  BP: (!) 158/95  Pulse: 88  Temp: 97.8 F (36.6 C)  SpO2: 98%    Physical Exam Vitals and nursing note reviewed.  Constitutional:      Appearance: Normal appearance.  HENT:     Head: Normocephalic and atraumatic.  Cardiovascular:     Rate and Rhythm: Normal rate.     Pulses: Normal pulses.  Pulmonary:     Effort: Pulmonary effort is normal. No respiratory distress.  Abdominal:     General: Abdomen is flat. There is no distension.     Tenderness: There is no abdominal tenderness.  Skin:    General: Skin is warm.     Capillary Refill: Capillary refill takes less than 2 seconds.  Neurological:     Mental Status: She is alert.  Psychiatric:        Mood and Affect: Mood normal.        Behavior: Behavior normal.        Thought Content: Thought content normal.        Judgment: Judgment normal.     Assessment & Plan:  Neck pain - Plan: Ambulatory referral to Physical Therapy,  MM Digital Screening  Chronic bilateral thoracic back pain - Plan: Ambulatory referral to Physical Therapy, MM Digital Screening  Symptomatic mammary hypertrophy - Plan: Ambulatory referral to Physical Therapy, MM Digital Screening  Recommend bilateral breast reduction with liposuction.  Recommend updated mammogram.  Also recommend physical therapy which the patient is willing to pursue.  Pictures were obtained of the patient and placed in the chart with the patient's or guardian's permission.  We discussed some of the risks which include loss or change of nipple areola sensation and scars.   Benjamin Perez, DO

## 2019-06-23 ENCOUNTER — Ambulatory Visit
Admission: RE | Admit: 2019-06-23 | Discharge: 2019-06-23 | Disposition: A | Discharge status: Home / Self Care | Payer: Managed Care, Other (non HMO) | Source: Ambulatory Visit | Attending: Plastic Surgery | Admitting: Plastic Surgery

## 2019-06-23 ENCOUNTER — Other Ambulatory Visit: Payer: Self-pay

## 2019-06-23 DIAGNOSIS — M546 Pain in thoracic spine: Secondary | ICD-10-CM

## 2019-06-23 DIAGNOSIS — M542 Cervicalgia: Secondary | ICD-10-CM

## 2019-06-23 DIAGNOSIS — N62 Hypertrophy of breast: Secondary | ICD-10-CM

## 2019-07-06 ENCOUNTER — Encounter: Payer: Self-pay | Admitting: Rehabilitative and Restorative Service Providers"

## 2019-07-06 ENCOUNTER — Ambulatory Visit
Payer: Managed Care, Other (non HMO) | Attending: Orthopaedic Surgery | Admitting: Rehabilitative and Restorative Service Providers"

## 2019-07-06 ENCOUNTER — Other Ambulatory Visit: Payer: Self-pay

## 2019-07-06 DIAGNOSIS — M6281 Muscle weakness (generalized): Secondary | ICD-10-CM | POA: Insufficient documentation

## 2019-07-06 DIAGNOSIS — R293 Abnormal posture: Secondary | ICD-10-CM | POA: Diagnosis present

## 2019-07-06 DIAGNOSIS — M546 Pain in thoracic spine: Secondary | ICD-10-CM | POA: Insufficient documentation

## 2019-07-06 DIAGNOSIS — M542 Cervicalgia: Secondary | ICD-10-CM

## 2019-07-06 NOTE — Therapy (Signed)
[place], [place], [address] Phone: [phone]   Fax:  [phone]  Physical Therapy Evaluation  Patient Details  Name: Colleen Bailey MRN: [id number] Date of Birth: [birth year] Referring Provider (PT): Dillingham   Encounter Date: 07/06/2019   PT End of Session - 07/06/19 1618    Visit Number 1    Number of Visits 10    Date for PT Re-Evaluation 08/10/19    Authorization Type [employer]    PT Start Time [phone]    PT Stop Time 0500    PT Time Calculation (min) 42 min    Activity Tolerance Patient tolerated treatment well;No increased pain    Behavior During Therapy [facility] for tasks assessed/performed           Past Medical History:  Diagnosis Date   Anemia 12/27/2012   Elevated BP 03/07/2013   Hyperlipidemia     Past Surgical History:  Procedure Laterality Date   CESAREAN SECTION     X2   CHOLECYSTECTOMY  05-05-2013   LITHOTRIPSY     TUBAL LIGATION      There were no vitals filed for this visit.    Subjective Assessment - 07/06/19 1621    Subjective All in here hurts (neck and thoracic). I always have pain; my breasts pull me down too    Pertinent History no significant findings    Limitations Sitting;Writing;House hold activities;Lifting;Standing;Walking    How long can you sit comfortably? 15-30 min    How long can you stand comfortably? unsure    How long can you walk comfortably? unsure    Diagnostic tests head CT 2020 for stroke; negative    Patient Stated Goals to not have pain    Currently in Pain? Yes    Pain Score 7     Pain Location Neck    Pain Orientation Right;Left    Pain Descriptors / Indicators Aching;Tightness;Heaviness;Throbbing    Pain Type Chronic pain    Pain Radiating Towards stays localized    Pain Onset More than a month ago    Pain Frequency Intermittent    Aggravating Factors  sitting    Pain Relieving Factors pain meds    Effect of Pain on Daily  Activities needs to rest with activities    Multiple Pain Sites Yes    Pain Score 7    Pain Location Thoracic    Pain Orientation Right;Left    Pain Descriptors / Indicators Aching;Throbbing    Pain Type Chronic pain    Pain Onset More than a month ago    Pain Frequency Intermittent    Aggravating Factors  sitting    Pain Relieving Factors medicine    Effect of Pain on Daily Activities needs to rest              [facility] PT Assessment - 07/06/19 0001      Assessment   Medical Diagnosis neck pain    Referring Provider (PT) Dillingham    Onset Date/Surgical Date 06/14/18    Hand Dominance Right    Next MD Visit 08/16/2019    Prior Therapy none      Precautions   Precautions None      Restrictions   Weight Bearing Restrictions No      Balance Screen   Has the patient fallen in the past 6 months No      [place] residence      Prior Function  Level of Independence Independent      Cognition   Overall Cognitive Status Within Functional Limits for tasks assessed      Observation/Other Assessments   Focus on Therapeutic Outcomes (FOTO)  43% limited      Coordination   Gross Motor Movements are Fluid and Coordinated Yes      Posture/Postural Control   Posture Comments rounded shoulder, forward head, larger breasts      ROM / Strength   AROM / PROM / Strength AROM;PROM      AROM   Overall AROM Comments shoulder AROM WNL and =; cervical flex 55, R rot 60, L rot 45      PROM   Overall PROM Comments Pt unable to completely relax for PROM cervical assessment; Cervical flexion experienced a hard end feel in conjunction with all other motions      Palpation   Spinal mobility T2 vertebrae with R shift; T2-T4 tender    Palpation comment R Upper Trap tighter; R distal Pec tighter; R Rhomboids more tender; scapulae =                      Objective measurements completed on examination: See above findings.                PT Education - 07/06/19 1838    Education Details PT discussed with the patient heaviness of the breasts can lead to poor posture, forward head and cervicothoracic pain. PT discussed importnace of postural awareness.    Person(s) Educated Patient    Methods Explanation    Comprehension Verbalized understanding               PT Long Term Goals - 07/06/19 1845      PT LONG TERM GOAL #1   Title pt will be independent with HEP to assist with cervicothoracic pain reduction with functional mobility    Baseline not issued at eval due to time constraint    Time 5    Period Weeks    Status New    Target Date 08/10/19      PT LONG TERM GOAL #2   Title Foto will improve to 33% limitation    Baseline 43%    Time 5    Period Weeks    Status New    Target Date 08/10/19      PT LONG TERM GOAL #3   Title Pt will report overall cervicothoracic pain to be improved by 75% with all activities or </= 3/10 consistently    Baseline 7/10 at eval    Time 5    Period Weeks    Status New    Target Date 08/10/19                  Plan - 07/06/19 1840    Clinical Impression Statement Pt presents to PT with complaints of cervicothoracic chronic pain. She is a potential candidate for breast reduction surgery. She has poor posture with rounded shoulders and forward head. She has Upper Trap and Pec tightness esp on the right side with tenderness to mobilization assessment of T2-T4 with R shift of T2. Pt would benefit from pec stretching and postural strengthening to be inclusive of cervical flexibility exercises. Modalities and manual therapy to be performed as needed.    Examination-Activity Limitations Locomotion Level;Stand;Sit;Sleep    Examination-Participation Restrictions Shop;Cleaning;Community Activity;Driving    Stability/Clinical Decision Making Stable/Uncomplicated    Clinical Decision Making Low    Rehab  Potential Good    PT Frequency 2x / week    PT  Duration Other (comment)   5 weeks   PT Treatment/Interventions Cryotherapy;Ultrasound;Moist Heat;Electrical Stimulation;Functional mobility training;Therapeutic exercise;Therapeutic activities;Patient/family education;Manual techniques;Passive range of motion;Dry needling;Taping    PT Next Visit Plan Issue HEP for postural strengthening, cervical and pec flexibility, ADL handout for proper sititng posture    Consulted and Agree with Plan of Care Patient           Patient will benefit from skilled therapeutic intervention in order to improve the following deficits and impairments:  Decreased endurance, Decreased mobility, Hypomobility, Increased muscle spasms, Decreased range of motion, Decreased activity tolerance, Decreased strength, Impaired flexibility, Postural dysfunction, Pain  Visit Diagnosis: Cervicalgia  Pain in thoracic spine  Abnormal posture  Muscle weakness (generalized)     Problem List Patient Active Problem List   Diagnosis Date Noted   Neck pain 06/17/2019   Back pain 06/17/2019   Symptomatic mammary hypertrophy 06/17/2019   Perimenopause 11/26/2015   Tear of lateral meniscus of left knee 08/02/2015   Painful patella 06/09/2014   Combined abdominal and pelvic pain 11/11/2013   Intramural leiomyoma of uterus 11/11/2013   Bilateral shoulder pain 08/25/2013   Pain in joint, shoulder region 05/26/2013   Chronic cholecystitis with calculus 03/18/2013   Elevated BP 03/07/2013   Dizziness and giddiness 03/07/2013   Annual physical exam 12/27/2012   Anemia 12/27/2012    Myra Rude, PT 07/06/2019, 6:53 PM  [place] [facility] [address] [place], [place], [address] Phone: [phone]   Fax:  [phone]  Name: Colleen Bailey MRN: [id number] Date of Birth: 1965/08/05

## 2019-07-21 ENCOUNTER — Other Ambulatory Visit: Payer: Self-pay

## 2019-07-21 ENCOUNTER — Ambulatory Visit: Payer: Managed Care, Other (non HMO) | Attending: Orthopaedic Surgery

## 2019-07-21 DIAGNOSIS — M546 Pain in thoracic spine: Secondary | ICD-10-CM | POA: Insufficient documentation

## 2019-07-21 DIAGNOSIS — M542 Cervicalgia: Secondary | ICD-10-CM

## 2019-07-21 DIAGNOSIS — M6281 Muscle weakness (generalized): Secondary | ICD-10-CM | POA: Insufficient documentation

## 2019-07-21 DIAGNOSIS — R293 Abnormal posture: Secondary | ICD-10-CM | POA: Diagnosis present

## 2019-07-21 NOTE — Therapy (Signed)
[place], [place], [address] Phone: [phone]   Fax:  [phone]  Physical Therapy Treatment  Patient Details  Name: Colleen Bailey MRN: [id number] Date of Birth: 03/19/1965 Referring Provider (PT): Dillingham   Encounter Date: 07/21/2019   PT End of Session - 07/21/19 1800    Visit Number 2    Number of Visits 10    Date for PT Re-Evaluation 08/10/19    Authorization Type [employer]    PT Start Time 0569    PT Stop Time 1700    PT Time Calculation (min) 43 min    Activity Tolerance Patient tolerated treatment well;No increased pain    Behavior During Therapy [facility]FL for tasks assessed/performed           Past Medical History:  Diagnosis Date  . Anemia 12/27/2012  . Elevated BP 03/07/2013  . Hyperlipidemia     Past Surgical History:  Procedure Laterality Date  . CESAREAN SECTION     X2  . CHOLECYSTECTOMY  05-05-2013  . LITHOTRIPSY    . TUBAL LIGATION      There were no vitals filed for this visit.   Subjective Assessment - 07/21/19 1750    Subjective pt subjective reports remains similar c complaints of neck, mid abck and low back pain. Pt rates pain a 6-7/10    Currently in Pain? Yes    Pain Score 7     Pain Location Back    Pain Orientation Upper;Mid;Lower    Pain Descriptors / Indicators Aching;Tightness;Heaviness;Throbbing    Pain Type Chronic pain    Pain Onset More than a month ago    Pain Frequency Intermittent    Pain Onset --    Pain Frequency --                             [facility] Adult PT Treatment/Exercise - 07/21/19 0001      Self-Care   Self-Care Other Self-Care Comments    Other Self-Care Comments  HEP for mid back strengthening and several back stretches, use of towel roll for posture, use of tennis ball and massage cane for painful, tight muscles. Pt is to try and determine which stretches are helful to her.      Exercises   Exercises  Neck;Shoulder;Lumbar      Lumbar Exercises: Stretches   Quadruped Mid Back Stretch 10 seconds;3 reps    Quadruped Mid Back Stretch Limitations raech through/reach out and up    Other Lumbar Stretch Exercise Cat/camel; 3x; 15 sec    Other Lumbar Stretch Exercise Child pose; 1x each direction; 10 sec; forward and laterally      Shoulder Exercises: Seated   Horizontal ABduction Strengthening;Both;15 reps;Theraband    Theraband Level (Shoulder Horizontal ABduction) Level 3 (Green)    External Rotation Strengthening;Both;15 reps;Theraband    Theraband Level (Shoulder External Rotation) Level 2 (Red)      Shoulder Exercises: Stretch   Other Shoulder Stretches Hug c thoracic rotation; 3x; 5 sec                  PT Education - 07/21/19 1756    Education Details HEP for mid back strengthening and several back stretches, use of towel roll for posture, use of tennis ball and massage cane for painful, tight muscles. Pt is to try and determine which stretches are helful to her.    Person(s) Educated Patient    Methods  Explanation;Demonstration;Tactile cues;Verbal cues;Handout    Comprehension Verbalized understanding;Returned demonstration;Verbal cues required;Tactile cues required;Need further instruction               PT Long Term Goals - 07/06/19 1845      PT LONG TERM GOAL #1   Title pt will be independent with HEP to assist with cervicothoracic pain reduction with functional mobility    Baseline not issued at eval due to time constraint    Time 5    Period Weeks    Status New    Target Date 08/10/19      PT LONG TERM GOAL #2   Title Foto will improve to 33% limitation    Baseline 43%    Time 5    Period Weeks    Status New    Target Date 08/10/19      PT LONG TERM GOAL #3   Title Pt will report overall cervicothoracic pain to be improved by 75% with all activities or </= 3/10 consistently    Baseline 7/10 at eval    Time 5    Period Weeks    Status New     Target Date 08/10/19                 Plan - 07/21/19 1801    Clinical Impression Statement Ther ex/HEP were completed to assist in muscle tightness, fatigue and pain associated c pt's neck, Mid back and low back. Addiitionally discused and completed trial of a towel roll for posture and the use of a tennis ball or massage cane for muscle/tension pain. Pt completed her HEP properly and reported understanding of tips to reduce muscle tension/pain.    Examination-Activity Limitations Locomotion Level;Stand;Sit;Sleep    Examination-Participation Restrictions Shop;Cleaning;Community Activity;Driving    Stability/Clinical Decision Making Stable/Uncomplicated    Clinical Decision Making Low    Rehab Potential Good    PT Frequency 2x / week    PT Duration Other (comment)    PT Treatment/Interventions Cryotherapy;Ultrasound;Moist Heat;Electrical Stimulation;Functional mobility training;Therapeutic exercise;Therapeutic activities;Patient/family education;Manual techniques;Passive range of motion;Dry needling;Taping    PT Next Visit Plan Asses resonse to HEP and adjust as indicated    PT Home Exercise Plan [email or web address]:    Consulted and Agree with Plan of Care Patient           Patient will benefit from skilled therapeutic intervention in order to improve the following deficits and impairments:  Decreased endurance, Decreased mobility, Hypomobility, Increased muscle spasms, Decreased range of motion, Decreased activity tolerance, Decreased strength, Impaired flexibility, Postural dysfunction, Pain  Visit Diagnosis: Cervicalgia  Pain in thoracic spine  Abnormal posture  Muscle weakness (generalized)     Problem List Patient Active Problem List   Diagnosis Date Noted  . Neck pain 06/17/2019  . Back pain 06/17/2019  . Symptomatic mammary hypertrophy 06/17/2019  . Perimenopause 11/26/2015  . Tear of lateral meniscus of left knee 08/02/2015  . Painful patella 06/09/2014  . Combined  abdominal and pelvic pain 11/11/2013  . Intramural leiomyoma of uterus 11/11/2013  . Bilateral shoulder pain 08/25/2013  . Pain in joint, shoulder region 05/26/2013  . Chronic cholecystitis with calculus 03/18/2013  . Elevated BP 03/07/2013  . Dizziness and giddiness 03/07/2013  . Annual physical exam 12/27/2012  . Anemia 12/27/2012   [name] MS, PT 07/21/19 6:22 PM  [place] [facility] [address] [place], [place], [address] Phone: [phone]   Fax:  [phone]  Name: Whitleigh Garramone MRN: [id number] Date of  Birth: 03-09-1965

## 2019-07-26 ENCOUNTER — Encounter: Payer: Self-pay | Admitting: Physical Therapy

## 2019-07-26 ENCOUNTER — Other Ambulatory Visit: Payer: Self-pay

## 2019-07-26 ENCOUNTER — Ambulatory Visit: Payer: Managed Care, Other (non HMO) | Admitting: Physical Therapy

## 2019-07-26 DIAGNOSIS — M542 Cervicalgia: Secondary | ICD-10-CM

## 2019-07-26 DIAGNOSIS — M546 Pain in thoracic spine: Secondary | ICD-10-CM

## 2019-07-26 DIAGNOSIS — M6281 Muscle weakness (generalized): Secondary | ICD-10-CM

## 2019-07-26 DIAGNOSIS — R293 Abnormal posture: Secondary | ICD-10-CM

## 2019-07-26 NOTE — Patient Instructions (Signed)
Access Code: [email or web address] URL: [email or web address] Date: 07/26/2019 Prepared by: [name]  Exercises Sidelying Thoracic Lumbar Rotation - 1 x daily - 7 x weekly - 10 reps Seated Trunk Rotation - Arms Crossed - 1 x daily - 7 x weekly - 10 reps Gentle Upper Trap Stretch - 1 x daily - 7 x weekly - 2 reps - 20 seconds hold Doorway Pec Stretch at 60 Elevation - 1 x daily - 7 x weekly - 2 reps - 20 seconds hold Shoulder External Rotation and Scapular Retraction with Resistance - 1 x daily - 7 x weekly - 2 sets - 10 reps Standing Row with Anchored Resistance - 1 x daily - 7 x weekly - 2 sets - 10 reps Scapular Retraction with Resistance Advanced - 1 x daily - 7 x weekly - 2 sets - 10 reps

## 2019-07-26 NOTE — Therapy (Signed)
[place], [place], [address] Phone: [phone]   Fax:  [phone]  Physical Therapy Treatment  Patient Details  Name: Colleen Bailey MRN: [id number] Date of Birth: 05/28/1965 Referring Provider (PT): Dillingham   Encounter Date: 07/26/2019   PT End of Session - 07/26/19 1612    Visit Number 3    Number of Visits 10    Date for PT Re-Evaluation 08/10/19    Authorization Type [employer]    PT Start Time 1615    PT Stop Time 1655    PT Time Calculation (min) 40 min    Activity Tolerance Patient tolerated treatment well    Behavior During Therapy [facility] for tasks assessed/performed           Past Medical History:  Diagnosis Date  . Anemia 12/27/2012  . Elevated BP 03/07/2013  . Hyperlipidemia     Past Surgical History:  Procedure Laterality Date  . CESAREAN SECTION     X2  . CHOLECYSTECTOMY  05-05-2013  . LITHOTRIPSY    . TUBAL LIGATION      There were no vitals filed for this visit.   Subjective Assessment - 07/26/19 1610    Subjective Patient reports continued neck, upper-lower back pain. She is tired this visit because she has been working from [place]. She has not been able to do many of her exercises because of time at work and discomfort.    Patient Stated Goals to not have pain    Currently in Pain? Yes    Pain Score 6     Pain Location Back    Pain Orientation Upper;Mid;Lower    Pain Descriptors / Indicators Aching;Tightness    Pain Type Chronic pain    Pain Onset More than a month ago    Pain Frequency Constant                             [facility] Adult PT Treatment/Exercise - 07/26/19 0001      Exercises   Exercises Neck      Neck Exercises: Machines for Strengthening   UBE (Upper Arm Bike) L1 x 4 min (2 fwd/bwd)      Neck Exercises: Theraband   Shoulder Extension 10 reps   2 sets   Shoulder Extension Limitations yellow    Rows 10 reps   2 sets   Rows  Limitations yellow    Shoulder External Rotation 10 reps   2 sets   Shoulder External Rotation Limitations yellow    Horizontal ABduction 10 reps   2 sets   Horizontal ABduction Limitations yellow      Neck Exercises: Stretches   Upper Trapezius Stretch 2 reps;20 seconds    Chest Stretch 2 reps;20 seconds    Chest Stretch Limitations doorway amrs at 60 deg    Other Neck Stretches Sidelying thoracic rotation x10 each    Other Neck Stretches Seated thoracic rotation x 10 each                  PT Education - 07/26/19 1611    Education Details HEP update and consistency    Person(s) Educated Patient    Methods Explanation;Demonstration;Verbal cues;Handout;Tactile cues    Comprehension Verbalized understanding;Returned demonstration;Verbal cues required;Need further instruction;Tactile cues required               PT Long Term Goals - 07/06/19 1845      PT  LONG TERM GOAL #1   Title pt will be independent with HEP to assist with cervicothoracic pain reduction with functional mobility    Baseline not issued at eval due to time constraint    Time 5    Period Weeks    Status New    Target Date 08/10/19      PT LONG TERM GOAL #2   Title Foto will improve to 33% limitation    Baseline 43%    Time 5    Period Weeks    Status New    Target Date 08/10/19      PT LONG TERM GOAL #3   Title Pt will report overall cervicothoracic pain to be improved by 75% with all activities or </= 3/10 consistently    Baseline 7/10 at eval    Time 5    Period Weeks    Status New    Target Date 08/10/19                 Plan - 07/26/19 1612    Clinical Impression Statement Patient limited by pain and fatigue this visit as she had been working all morning/day. Her HEP was updates to make if more feasable for her to perform at home. She continues to exhibit postural deficits and poor postural control during exercises with upper trap dominance. She required consistent cueing for  proper scapular mechanics to avoid shoulder shrug. She would benefit from continued skilled PT to progress thoracic mobility and postural control to reduce pain and maximize functional level.    PT Treatment/Interventions Cryotherapy;Ultrasound;Moist Heat;Electrical Stimulation;Functional mobility training;Therapeutic exercise;Therapeutic activities;Patient/family education;Manual techniques;Passive range of motion;Dry needling;Taping    PT Next Visit Plan Asses resonse to HEP and progress PRN, thoracic mobility and postural strengthening    PT Home Exercise Plan [email or web address]    Consulted and Agree with Plan of Care Patient           Patient will benefit from skilled therapeutic intervention in order to improve the following deficits and impairments:  Decreased endurance, Decreased mobility, Hypomobility, Increased muscle spasms, Decreased range of motion, Decreased activity tolerance, Decreased strength, Impaired flexibility, Postural dysfunction, Pain  Visit Diagnosis: Cervicalgia  Pain in thoracic spine  Abnormal posture  Muscle weakness (generalized)     Problem List Patient Active Problem List   Diagnosis Date Noted  . Neck pain 06/17/2019  . Back pain 06/17/2019  . Symptomatic mammary hypertrophy 06/17/2019  . Perimenopause 11/26/2015  . Tear of lateral meniscus of left knee 08/02/2015  . Painful patella 06/09/2014  . Combined abdominal and pelvic pain 11/11/2013  . Intramural leiomyoma of uterus 11/11/2013  . Bilateral shoulder pain 08/25/2013  . Pain in joint, shoulder region 05/26/2013  . Chronic cholecystitis with calculus 03/18/2013  . Elevated BP 03/07/2013  . Dizziness and giddiness 03/07/2013  . Annual physical exam 12/27/2012  . Anemia 12/27/2012    [name], PT, DPT, LAT, ATC 07/26/19  5:00 PM Phone: [phone] Fax: [place] [facility] [address] [place], [place], [address] Phone:  [phone]   Fax:  [phone]  Name: Rocio Roam MRN: [id number] Date of Birth: September 24, 1965

## 2019-07-28 ENCOUNTER — Other Ambulatory Visit: Payer: Self-pay

## 2019-07-28 ENCOUNTER — Ambulatory Visit: Payer: Managed Care, Other (non HMO) | Admitting: Physical Therapy

## 2019-07-28 ENCOUNTER — Encounter: Payer: Self-pay | Admitting: Physical Therapy

## 2019-07-28 DIAGNOSIS — M546 Pain in thoracic spine: Secondary | ICD-10-CM

## 2019-07-28 DIAGNOSIS — R293 Abnormal posture: Secondary | ICD-10-CM

## 2019-07-28 DIAGNOSIS — M542 Cervicalgia: Secondary | ICD-10-CM

## 2019-07-28 DIAGNOSIS — M6281 Muscle weakness (generalized): Secondary | ICD-10-CM

## 2019-07-28 NOTE — Therapy (Signed)
[place], [place], [address] Phone: [phone]   Fax:  [phone]  Physical Therapy Treatment  Patient Details  Name: Colleen Bailey MRN: [id number] Date of Birth: November 04, 1965 Referring Provider (PT): Dillingham   Encounter Date: 07/28/2019   PT End of Session - 07/28/19 1619    Visit Number 4    Number of Visits 10    Date for PT Re-Evaluation 08/10/19    Authorization Type [employer]    PT Start Time 1615    PT Stop Time 1655    PT Time Calculation (min) 40 min    Activity Tolerance Patient limited by pain    Behavior During Therapy [facility] for tasks assessed/performed           Past Medical History:  Diagnosis Date  . Anemia 12/27/2012  . Elevated BP 03/07/2013  . Hyperlipidemia     Past Surgical History:  Procedure Laterality Date  . CESAREAN SECTION     X2  . CHOLECYSTECTOMY  05-05-2013  . LITHOTRIPSY    . TUBAL LIGATION      There were no vitals filed for this visit.   Subjective Assessment - 07/28/19 1617    Subjective Patient reports she is still feeling about the same. Her entired back is bothering her. She has worked all day and has pain after work. She has not really done her exercises since last visit.    Patient Stated Goals to not have pain    Currently in Pain? Yes    Pain Score 6     Pain Location Back    Pain Orientation Upper;Mid;Lower    Pain Descriptors / Indicators Aching;Tightness    Pain Type Chronic pain    Pain Onset More than a month ago    Pain Frequency Constant              [facility] PT Assessment - 07/28/19 0001      Observation/Other Assessments   Focus on Therapeutic Outcomes (FOTO)  NA - mammary hypertrophy dx so no applicable      Posture/Postural Control   Posture Comments Patient continues to exhibit rounded shoulder and forward head posture                          [facility] Adult PT Treatment/Exercise - 07/28/19 0001      Exercises     Exercises Neck;Lumbar      Neck Exercises: Theraband   Shoulder Extension 10 reps   2 sets   Shoulder Extension Limitations yellow    Rows 10 reps   2 sets   Rows Limitations yellow    Shoulder External Rotation 10 reps   2 sets   Shoulder External Rotation Limitations yellow    Horizontal ABduction 10 reps   2 sets   Horizontal ABduction Limitations yellow - supine      Neck Exercises: Seated   Shoulder Rolls 10 reps    Shoulder Rolls Limitations shoulder blade squeezes    Other Seated Exercise Thoracic extension over towel in chair 2x5 with hands behind head and elbows spread      Lumbar Exercises: Stretches   Passive Hamstring Stretch 20 seconds    Single Knee to Chest Stretch 20 seconds    Lower Trunk Rotation 5 reps;10 seconds    Piriformis Stretch 20 seconds      Lumbar Exercises: Aerobic   Nustep L1 x 5 min (UE and LE)  Neck Exercises: Stretches   Upper Trapezius Stretch 2 reps;20 seconds    Chest Stretch 2 reps;20 seconds    Chest Stretch Limitations doorway arms at 60 deg    Other Neck Stretches Sidelying thoracic rotation x10 each                  PT Education - 07/28/19 1618    Education Details HEP consistency    Person(s) Educated Patient    Methods Explanation;Verbal cues    Comprehension Verbalized understanding;Verbal cues required;Need further instruction               PT Long Term Goals - 07/06/19 1845      PT LONG TERM GOAL #1   Title pt will be independent with HEP to assist with cervicothoracic pain reduction with functional mobility    Baseline not issued at eval due to time constraint    Time 5    Period Weeks    Status New    Target Date 08/10/19      PT LONG TERM GOAL #2   Title Foto will improve to 33% limitation    Baseline 43%    Time 5    Period Weeks    Status New    Target Date 08/10/19      PT LONG TERM GOAL #3   Title Pt will report overall cervicothoracic pain to be improved by 75% with all activities  or </= 3/10 consistently    Baseline 7/10 at eval    Time 5    Period Weeks    Status New    Target Date 08/10/19                 Plan - 07/28/19 1619    Clinical Impression Statement Patient is not tolerating therapy well because she reports increased pain following work. She was noting increased lower back discomfort this visit to trialed light stretching but patient continued to report pain with all movement. Trialed seated thoracic extension exercise but d/c'd with patient reporting increased pain due to pressure. She did not tolertae quadruped positioning so did not try any stretches in that position. Continued working on postural control as able and reviewed HEP that was updated last visit. She was encouraged to contact her doctor to see if she has met criteria to pursue breast reduction surgery. Until that point, she would benefit from continued skilled PT to progress thoracic mobility and postural control to reduce pain and maximize functional level.    PT Treatment/Interventions Cryotherapy;Ultrasound;Moist Heat;Electrical Stimulation;Functional mobility training;Therapeutic exercise;Therapeutic activities;Patient/family education;Manual techniques;Passive range of motion;Dry needling;Taping    PT Next Visit Plan Asses resonse to HEP and progress PRN, thoracic mobility and postural strengthening as tolerated,    PT Home Exercise Plan [id number]    Consulted and Agree with Plan of Care Patient           Patient will benefit from skilled therapeutic intervention in order to improve the following deficits and impairments:  Decreased endurance, Decreased mobility, Hypomobility, Increased muscle spasms, Decreased range of motion, Decreased activity tolerance, Decreased strength, Impaired flexibility, Postural dysfunction, Pain  Visit Diagnosis: Pain in thoracic spine  Cervicalgia  Abnormal posture  Muscle weakness (generalized)     Problem List Patient Active Problem List    Diagnosis Date Noted  . Neck pain 06/17/2019  . Back pain 06/17/2019  . Symptomatic mammary hypertrophy 06/17/2019  . Perimenopause 11/26/2015  . Tear of lateral meniscus of left knee 08/02/2015  . Painful  patella 06/09/2014  . Combined abdominal and pelvic pain 11/11/2013  . Intramural leiomyoma of uterus 11/11/2013  . Bilateral shoulder pain 08/25/2013  . Pain in joint, shoulder region 05/26/2013  . Chronic cholecystitis with calculus 03/18/2013  . Elevated BP 03/07/2013  . Dizziness and giddiness 03/07/2013  . Annual physical exam 12/27/2012  . Anemia 12/27/2012    [name], PT, DPT, LAT, ATC 07/28/19  5:11 PM Phone: [phone] Fax: [facility] [address] [place], [place], [address] Phone: [phone]   Fax:  [phone]  Name: Mahoganie Basher MRN: [id number] Date of Birth: Jun 08, 1965

## 2019-08-01 ENCOUNTER — Ambulatory Visit: Payer: Managed Care, Other (non HMO)

## 2019-08-01 ENCOUNTER — Other Ambulatory Visit: Payer: Self-pay

## 2019-08-01 DIAGNOSIS — M546 Pain in thoracic spine: Secondary | ICD-10-CM

## 2019-08-01 DIAGNOSIS — M542 Cervicalgia: Secondary | ICD-10-CM | POA: Diagnosis not present

## 2019-08-01 DIAGNOSIS — M6281 Muscle weakness (generalized): Secondary | ICD-10-CM

## 2019-08-01 DIAGNOSIS — R293 Abnormal posture: Secondary | ICD-10-CM

## 2019-08-02 NOTE — Therapy (Signed)
[place], [place], [address] Phone: [phone]   Fax:  [phone]  Physical Therapy Treatment  Patient Details  Name: Colleen Bailey MRN: [id number] Date of Birth: 02-10-1965 Referring Provider (PT): Dillingham   Encounter Date: 08/01/2019   PT End of Session - 08/02/19 1010    Visit Number 5    Number of Visits 10    Date for PT Re-Evaluation 08/10/19    Authorization Type [employer]    PT Start Time 1625    PT Stop Time 1705    PT Time Calculation (min) 40 min    Activity Tolerance Patient limited by pain    Behavior During Therapy [facility] for tasks assessed/performed           Past Medical History:  Diagnosis Date  . Anemia 12/27/2012  . Elevated BP 03/07/2013  . Hyperlipidemia     Past Surgical History:  Procedure Laterality Date  . CESAREAN SECTION     X2  . CHOLECYSTECTOMY  05-05-2013  . LITHOTRIPSY    . TUBAL LIGATION      There were no vitals filed for this visit.   Subjective Assessment - 08/02/19 1003    Subjective Pt reports her lower back continues to bother her especially after work. Pt reports she completes her scapular retraction and trunk rotation midback stretch for pain and muscle tightness.    Currently in Pain? Yes    Pain Score 6     Pain Location Back    Pain Orientation Upper;Mid;Lower;Posterior    Pain Descriptors / Indicators Aching;Tightness    Pain Type Chronic pain    Pain Onset More than a month ago    Pain Frequency Constant    Aggravating Factors  work                             [facility] Adult PT Treatment/Exercise - 08/02/19 0001      Exercises   Exercises Neck;Lumbar      Neck Exercises: Machines for Strengthening   UBE (Upper Arm Bike) L1 x 4 min (2 fwd/bwd)      Neck Exercises: Theraband   Shoulder Extension 10 reps;Red   2 sets   Rows 10 reps;Red   2 sets   Shoulder External Rotation 10 reps;Red    Horizontal ABduction 10  reps;Red      Neck Exercises: Seated   Shoulder Rolls 10 reps    Shoulder Rolls Limitations shoulder blade squeezes    Other Seated Exercise Thoracic extension over towel in chair 5x2 sets; reaching hands up and behind head       Lumbar Exercises: Stretches   Passive Hamstring Stretch Right;Left;2 reps;20 seconds    Single Knee to Chest Stretch Right;Left;2 reps;20 seconds    Lower Trunk Rotation 5 reps;10 seconds    Piriformis Stretch Right;Left;2 reps;20 seconds      Neck Exercises: Stretches   Chest Stretch 2 reps;20 seconds    Chest Stretch Limitations doorway                       PT Long Term Goals - 07/06/19 1845      PT LONG TERM GOAL #1   Title pt will be independent with HEP to assist with cervicothoracic pain reduction with functional mobility    Baseline not issued at eval due to time constraint    Time 5    Period  Weeks    Status New    Target Date 08/10/19      PT LONG TERM GOAL #2   Title Foto will improve to 33% limitation    Baseline 43%    Time 5    Period Weeks    Status New    Target Date 08/10/19      PT LONG TERM GOAL #3   Title Pt will report overall cervicothoracic pain to be improved by 75% with all activities or </= 3/10 consistently    Baseline 7/10 at eval    Time 5    Period Weeks    Status New    Target Date 08/10/19                 Plan - 08/02/19 1011    Clinical Impression Statement PT focused on midback and lumbar flexibility and strengthening exs. Pt's low back pain level remains significant which are aggrevated by work. Pt participates in PT well. Continue with PT for back mobility and strengthening to address muscle tightness and postural strain.    Examination-Activity Limitations Locomotion Level;Stand;Sit;Sleep    Examination-Participation Restrictions Shop;Cleaning;Community Activity;Driving    Stability/Clinical Decision Making Stable/Uncomplicated    Clinical Decision Making Low    Rehab Potential Good     PT Frequency 2x / week    PT Treatment/Interventions Cryotherapy;Ultrasound;Moist Heat;Electrical Stimulation;Functional mobility training;Therapeutic exercise;Therapeutic activities;Patient/family education;Manual techniques;Passive range of motion;Dry needling;Taping    PT Next Visit Plan Reassess to determine benefit of PT and continuation of care    PT [place] and Agree with Plan of Care Patient           Patient will benefit from skilled therapeutic intervention in order to improve the following deficits and impairments:  Decreased endurance, Decreased mobility, Hypomobility, Increased muscle spasms, Decreased range of motion, Decreased activity tolerance, Decreased strength, Impaired flexibility, Postural dysfunction, Pain  Visit Diagnosis: Pain in thoracic spine  Cervicalgia  Abnormal posture  Muscle weakness (generalized)     Problem List Patient Active Problem List   Diagnosis Date Noted  . Neck pain 06/17/2019  . Back pain 06/17/2019  . Symptomatic mammary hypertrophy 06/17/2019  . Perimenopause 11/26/2015  . Tear of lateral meniscus of left knee 08/02/2015  . Painful patella 06/09/2014  . Combined abdominal and pelvic pain 11/11/2013  . Intramural leiomyoma of uterus 11/11/2013  . Bilateral shoulder pain 08/25/2013  . Pain in joint, shoulder region 05/26/2013  . Chronic cholecystitis with calculus 03/18/2013  . Elevated BP 03/07/2013  . Dizziness and giddiness 03/07/2013  . Annual physical exam 12/27/2012  . Anemia 12/27/2012    [name] MS, PT 08/02/19 10:29 AM  [facility] [address] [place], [place], [address] Phone: [phone]   Fax:  [phone]  Name: Colleen Bailey MRN: [id number] Date of Birth: [birth year]

## 2019-08-11 ENCOUNTER — Ambulatory Visit: Payer: Managed Care, Other (non HMO) | Admitting: Physical Therapy

## 2019-08-11 ENCOUNTER — Encounter: Payer: Self-pay | Admitting: Physical Therapy

## 2019-08-11 ENCOUNTER — Other Ambulatory Visit: Payer: Self-pay

## 2019-08-11 DIAGNOSIS — M542 Cervicalgia: Secondary | ICD-10-CM

## 2019-08-11 DIAGNOSIS — M6281 Muscle weakness (generalized): Secondary | ICD-10-CM

## 2019-08-11 DIAGNOSIS — M546 Pain in thoracic spine: Secondary | ICD-10-CM

## 2019-08-11 DIAGNOSIS — R293 Abnormal posture: Secondary | ICD-10-CM

## 2019-08-11 NOTE — Therapy (Signed)
[place], [place], [address] Phone: [phone]   Fax:  [phone]  Physical Therapy Treatment / Discharge  Patient Details  Name: Colleen Bailey MRN: [id number] Date of Birth: 04-08-1965 Referring Provider (PT): Wallace Going, DO   Encounter Date: 08/11/2019   PT End of Session - 08/11/19 1528    Visit Number 6    Authorization Type [employer]    PT Start Time 1520    PT Stop Time 1600    PT Time Calculation (min) 40 min    Activity Tolerance Patient limited by pain    Behavior During Therapy [facility] for tasks assessed/performed           Past Medical History:  Diagnosis Date  . Anemia 12/27/2012  . Elevated BP 03/07/2013  . Hyperlipidemia     Past Surgical History:  Procedure Laterality Date  . CESAREAN SECTION     X2  . CHOLECYSTECTOMY  05-05-2013  . LITHOTRIPSY    . TUBAL LIGATION      There were no vitals filed for this visit.   Subjective Assessment - 08/11/19 1522    Subjective Patient reports back is feel okay. She has days where it feels better but most days it hurts. She still has a lot of pain with working and afterward.    How long can you sit comfortably? 15 minutes    How long can you stand comfortably? 15 minutes    How long can you walk comfortably? 15 minutes    Patient Stated Goals to not have pain    Currently in Pain? Yes    Pain Score 5     Pain Location Back    Pain Orientation Upper;Mid;Lower    Pain Descriptors / Indicators Aching;Tightness    Pain Type Chronic pain    Pain Radiating Towards more lower back today    Pain Onset More than a month ago    Pain Frequency Constant    Aggravating Factors  Work, pain is constant    Pain Relieving Factors Medications, heat pack              [facility] PT Assessment - 08/11/19 0001      Assessment   Medical Diagnosis Neck and thoracic pain    Referring Provider (PT) Dillingham, Loel Lofty, DO    Onset Date/Surgical  Date 06/14/18    Next MD Visit 08/26/2019      Precautions   Precautions None      Restrictions   Weight Bearing Restrictions No      Balance Screen   Has the patient fallen in the past 6 months No    Has the patient had a decrease in activity level because of a fear of falling?  No    Is the patient reluctant to leave their home because of a fear of falling?  No      Prior Function   Level of Independence Independent      Cognition   Overall Cognitive Status Within Functional Limits for tasks assessed      Observation/Other Assessments   Observations Patient appears in no apparent distress    Focus on Therapeutic Outcomes (FOTO)  NA - mammary hypertrophy dx so no applicable      Posture/Postural Control   Posture Comments Patient continues to exhibit rounded shoulder and forward head posture - she exhibits difficulty and increased pain with postural correction       AROM   Overall  AROM Comments Shoulder AROM grossly WFL, limitation with thoracic motion secondary to pain      Palpation   Spinal mobility Not assessed due to increased pain      Transfers   Transfers Independent with all Transfers                         [facility] Adult PT Treatment/Exercise - 08/11/19 0001      Self-Care   Other Self-Care Comments  See patient education portion of note      Exercises   Exercises Neck;Lumbar      Neck Exercises: Theraband   Shoulder Extension 10 reps   2 sets   Shoulder Extension Limitations yellow    Rows 10 reps   2 sets   Rows Limitations yellow    Shoulder External Rotation 10 reps   2 sets   Shoulder External Rotation Limitations yellow - seated    Horizontal ABduction 10 reps   2 sets   Horizontal ABduction Limitations yellow - supine      Neck Exercises: Seated   Shoulder Rolls 5 reps    Shoulder Rolls Limitations shoulder blade squeezes    Other Seated Exercise Unable to tolerate seated thoracic extension      Lumbar Exercises: Stretches    Single Knee to Chest Stretch 3 reps;10 seconds    Lower Trunk Rotation 3 reps;10 seconds      Lumbar Exercises: Aerobic   Nustep L1 x 5 min (UE and LE)      Neck Exercises: Stretches   Chest Stretch 3 reps;10 seconds    Chest Stretch Limitations dooway with arms at 60    Other Neck Stretches Sidelying thoracic rotation 5x10 sec each                  PT Education - 08/11/19 1527    Education Details HEP finalization, discharge from PT and follow-up with doctor    Person(s) Educated Patient    Methods Explanation;Demonstration;Handout    Comprehension Verbalized understanding;Returned demonstration               PT Long Term Goals - 08/11/19 1559      PT LONG TERM GOAL #1   Title pt will be independent with HEP to assist with cervicothoracic pain reduction with functional mobility    Baseline Patient independent with HEP - 08/11/2019    Time 5    Period Weeks    Status Achieved      PT LONG TERM GOAL #2   Title Foto will improve to 33% limitation    Baseline FOTO deferred due to wrong body part    Time 5    Period Weeks    Status Deferred      PT LONG TERM GOAL #3   Title Pt will report overall cervicothoracic pain to be improved by 75% with all activities or </= 3/10 consistently    Baseline Patient reports continued pain, this visit 5/10  - 08/11/2019    Time 5    Period Weeks    Status Not Met                 Plan - 08/11/19 1528    Clinical Impression Statement Patient continues to be limited by pain with therapy progressions. This visit focused on finalizing patient's HEP and ensuring proper form and tolerance. She will follow-up with her doctor on 08/26/2019 and patient did not want to schedule any further visits  due to lack of progress with PT and continued pain. Patient will be discharged from PT with HEP.    PT Treatment/Interventions --    PT Next Visit Plan NA    PT Home Exercise Plan [email or web address]    Consulted and Agree with Plan of Care  Patient           Patient will benefit from skilled therapeutic intervention in order to improve the following deficits and impairments:  Decreased endurance, Decreased mobility, Hypomobility, Increased muscle spasms, Decreased range of motion, Decreased activity tolerance, Decreased strength, Impaired flexibility, Postural dysfunction, Pain  Visit Diagnosis: Pain in thoracic spine  Cervicalgia  Abnormal posture  Muscle weakness (generalized)     Problem List Patient Active Problem List   Diagnosis Date Noted  . Neck pain 06/17/2019  . Back pain 06/17/2019  . Symptomatic mammary hypertrophy 06/17/2019  . Perimenopause 11/26/2015  . Tear of lateral meniscus of left knee 08/02/2015  . Painful patella 06/09/2014  . Combined abdominal and pelvic pain 11/11/2013  . Intramural leiomyoma of uterus 11/11/2013  . Bilateral shoulder pain 08/25/2013  . Pain in joint, shoulder region 05/26/2013  . Chronic cholecystitis with calculus 03/18/2013  . Elevated BP 03/07/2013  . Dizziness and giddiness 03/07/2013  . Annual physical exam 12/27/2012  . Anemia 12/27/2012    PHYSICAL THERAPY DISCHARGE SUMMARY  Visits from Start of Care: 6  Current functional level related to goals / functional outcomes: See above   Remaining deficits: See above   Education / Equipment: HEP, postural correction Plan: Patient agrees to discharge.  Patient goals were not met. Patient is being discharged due to lack of progress.  ?????     [name], PT, DPT, LAT, ATC 08/11/19  4:09 PM Phone: [phone] Fax: [facility] [address] [place], [place], [address] Phone: [phone]   Fax:  [phone]  Name: Paizlie Klaus MRN: [id number] Date of Birth: 11/28/1965

## 2019-08-11 NOTE — Patient Instructions (Addendum)
Access Code: [email or web address] URL: [email or web address] Date: 08/11/2019 Prepared by: [name]  Exercises Supine Lower Trunk Rotation - 1-2 x daily - 7 x weekly - 5 reps - 10 seconds hold Hooklying Single Knee to Chest Stretch - 1-2 x daily - 7 x weekly - 3 reps - 15 seconds hold Sidelying Thoracic Lumbar Rotation - 1-2 x daily - 7 x weekly - 5 reps - 10 seconds hold Seated Scapular Retraction - 1-2 x daily - 7 x weekly - 10 reps - 5 seconds hold Gentle Upper Trap Stretch - 1-2 x daily - 7 x weekly - 2 reps - 20 seconds hold Doorway Pec Stretch at 60 Elevation - 1-2 x daily - 7 x weekly - 2 reps - 20 seconds hold Shoulder External Rotation and Scapular Retraction with Resistance - 1 x daily - 7 x weekly - 2 sets - 10 reps Standing Row with Anchored Resistance - 1 x daily - 7 x weekly - 2 sets - 10 reps Scapular Retraction with Resistance Advanced - 1 x daily - 7 x weekly - 2 sets - 10 reps Supine Shoulder Horizontal Abduction with Resistance - 1 x daily - 7 x weekly - 2 sets - 10 reps

## 2019-08-16 ENCOUNTER — Ambulatory Visit: Payer: Managed Care, Other (non HMO) | Admitting: Plastic Surgery

## 2019-08-25 NOTE — Progress Notes (Signed)
Patient is a 54 year old female with a history of symptomatic mammary hyperplasia for several years.  She presents today for follow-up after a consult on 06/17/2019 for bilateral breast reduction.  She is 4 feet 11 inches tall.  At that visit she weighed 145 pounds; 29.3 BMI.  She is a non-smoker.  Since last visit, Pt Sessions completed : 7/29, 7/19, 7/15, 7/13, 7/8, and 6/23.  Today she weights 147.2 lbs. Reports PT did not fully resolve her back and shoulder pain.  The [place] was signed into law in 2016 which includes the topic of electronic health records.  This provides immediate access to information in MyChart.  This includes consultation notes, operative notes, office notes, lab results and pathology reports.  If you have any questions about what you read please let us know at your next visit or call us at the office.  We are right here with you.

## 2019-08-26 ENCOUNTER — Ambulatory Visit (INDEPENDENT_AMBULATORY_CARE_PROVIDER_SITE_OTHER): Payer: Managed Care, Other (non HMO) | Admitting: Plastic Surgery

## 2019-08-26 ENCOUNTER — Encounter: Payer: Self-pay | Admitting: Plastic Surgery

## 2019-08-26 ENCOUNTER — Other Ambulatory Visit: Payer: Self-pay

## 2019-08-26 VITALS — BP 155/87 | HR 84 | Temp 98.9°F | Ht 60.0 in | Wt 147.2 lb

## 2019-08-26 DIAGNOSIS — M546 Pain in thoracic spine: Secondary | ICD-10-CM | POA: Diagnosis not present

## 2019-08-26 DIAGNOSIS — N62 Hypertrophy of breast: Secondary | ICD-10-CM

## 2019-08-26 DIAGNOSIS — M542 Cervicalgia: Secondary | ICD-10-CM | POA: Diagnosis not present

## 2019-08-26 DIAGNOSIS — G8929 Other chronic pain: Secondary | ICD-10-CM

## 2019-11-14 NOTE — Progress Notes (Signed)
Patient ID: [name], female    DOB: 1965/07/24, 54 y.o.   MRN: [id number]  Chief Complaint  Patient presents with  . Pre-op Exam      ICD-10-CM   1. Symptomatic mammary hypertrophy  N62   2. Chronic bilateral thoracic back pain  M54.6    G89.29   3. Neck pain  M54.2      History of Present Illness: Colleen Bailey is a 54 y.o.  female  with a history of macromastia.  She presents for preoperative evaluation for upcoming procedure, Bilateral breast reduction with possible liposuction, scheduled for 11/30/2019 with Dr. Marla Roe  The patient has not had problems with anesthesia. No history of DVT/PE.  No family history of DVT/PE.  No family or personal history of bleeding or clotting disorders.  Patient is not currently taking any blood thinners.  No history of CVA/MI.   Summary of Previous Visit: StN on right is 33cm, StN on left is 31cm, IMF distance is 14cm, Pre op bra size is 40-42 DDD cup, she would like to be around a B or C. Estimated excess tissue to be removed is 350 g on left and 350 g on right.   Recent Mammogram on 06/23/19 - Negative  Job: Operates machinery  PMH Significant for: Anemia History of cholecystectomy  Patient reports she has overall been feeling well lately, she does report that she has had a few deaths in her family which has been difficult for her.  Past Medical History: Allergies: No Known Allergies  Current Medications:  Current Outpatient Medications:  .  cephALEXin (KEFLEX) 500 MG capsule, Take 1 capsule (500 mg total) by mouth 4 (four) times daily for 3 days., Disp: 12 capsule, Rfl: 0 .  HYDROcodone-acetaminophen (NORCO) 5-325 MG tablet, Take 1 tablet by mouth every 6 (six) hours as needed for up to 5 days for severe pain., Disp: 20 tablet, Rfl: 0 .  ibuprofen (ADVIL,MOTRIN) 600 MG tablet, Take 1 tablet (600 mg total) by mouth every 8 (eight) hours as needed. (Patient not taking: Reported on 11/15/2019), Disp: 90 tablet, Rfl: 1 .   meclizine (ANTIVERT) 25 MG tablet, Take 2 tablets (50 mg total) by mouth 3 (three) times daily as needed for dizziness. (Patient not taking: Reported on 11/15/2019), Disp: 30 tablet, Rfl: 0 .  ondansetron (ZOFRAN) 4 MG tablet, Take 1 tablet (4 mg total) by mouth every 8 (eight) hours as needed for nausea or vomiting., Disp: 20 tablet, Rfl: 0  Past Medical Problems: Past Medical History:  Diagnosis Date  . Anemia 12/27/2012  . Elevated BP 03/07/2013  . Hyperlipidemia     Past Surgical History: Past Surgical History:  Procedure Laterality Date  . CESAREAN SECTION     X2  . CHOLECYSTECTOMY  05-05-2013  . LITHOTRIPSY    . TUBAL LIGATION      Social History: Social History   Socioeconomic History  . Marital status: Legally Separated    Spouse name: Not on file  . Number of children: 3  . Years of education: Not on file  . Highest education level: Not on file  Occupational History  . Occupation: works at [employer]: [employer]  . Smoking status: Never Smoker  . Smokeless tobacco: Never Used  Vaping Use  . Vaping Use: Never used  Substance and Sexual Activity  . Alcohol use: No    Alcohol/week: 0.0 standard drinks    Comment: rare  . Drug use: No  .  Sexual activity: Not Currently    Comment: 1st intercourse- ?, partners- 3,   Other Topics Concern  . Not on file  Social History Narrative   ** Merged History Encounter **       Original from [place] w/ son   Lives in [place] since ~ 2006   Social Determinants of Health   Financial Resource Strain:   . Difficulty of Paying Living Expenses: Not on file  Food Insecurity:   . Worried About Charity fundraiser in the Last Year: Not on file  . Ran Out of Food in the Last Year: Not on file  Transportation Needs:   . Lack of Transportation (Medical): Not on file  . Lack of Transportation (Non-Medical): Not on file  Physical Activity:   . Days of Exercise per Week: Not on file  . Minutes  of Exercise per Session: Not on file  Stress:   . Feeling of Stress : Not on file  Social Connections:   . Frequency of Communication with Friends and Family: Not on file  . Frequency of Social Gatherings with Friends and Family: Not on file  . Attends Religious Services: Not on file  . Active Member of Clubs or Organizations: Not on file  . Attends Archivist Meetings: Not on file  . Marital Status: Not on file  Intimate Partner Violence:   . Fear of Current or Ex-Partner: Not on file  . Emotionally Abused: Not on file  . Physically Abused: Not on file  . Sexually Abused: Not on file    Family History: Family History  Problem Relation Age of Onset  . Diabetes Father   . Cancer Neg Hx   . CAD Neg Hx   . Breast cancer Neg Hx     Review of Systems: Review of Systems  Constitutional: Negative.   Eyes: Negative.   Respiratory: Negative.   Cardiovascular: Negative.   Gastrointestinal: Negative.   Genitourinary: Negative.   Musculoskeletal: Negative.   Skin: Negative.     Physical Exam: Vital Signs BP (!) 177/114 (BP Location: Left Arm, Patient Position: Sitting, Cuff Size: Normal)   Pulse 67   Temp 98.7 F (37.1 C) (Oral)   Ht 5' (1.524 m)   Wt 145 lb 3.2 oz (65.9 kg)   LMP 10/23/2015   SpO2 99%   BMI 28.36 kg/m  Physical Exam Constitutional:      General: She is not in acute distress.    Appearance: Normal appearance. She is not ill-appearing.  HENT:     Head: Normocephalic and atraumatic.  Eyes:     Pupils: Pupils are equal, round Neck:     Musculoskeletal: Normal range of motion.  Cardiovascular:     Rate and Rhythm: Normal rate and regular rhythm.     Pulses: Normal pulses.     Heart sounds: Normal heart sounds. No murmur.  Pulmonary:     Effort: Pulmonary effort is normal. No respiratory distress.     Breath sounds: Normal breath sounds. No wheezing.  Abdominal:     General: Abdomen is flat. There is no distension.     Palpations: Abdomen  is soft.     Tenderness: There is no abdominal tenderness.  Musculoskeletal: Normal range of motion.  Skin:    General: Skin is warm and dry.     Findings: No erythema or rash.  Neurological:     General: No focal deficit present.     Mental Status: She  is alert and oriented to person, place, and time. Mental status is at baseline.     Motor: No weakness.  Psychiatric:        Mood and Affect: Mood normal.        Behavior: Behavior normal.     Assessment/Plan: The patient is scheduled for bilateral breast reduction with Dr. Marla Roe.  Risks, benefits, and alternatives of procedure discussed, questions answered and consent obtained.    Smoking Status: Non-smoker; Counseling Given?  N/A Last Mammogram: 06/23/2019; Results: Negative  Caprini Score: 4, moderate; Risk Factors include: Age, BMI greater than 25, and length of planned surgery. Recommendation for mechanical during surgery. Encourage early ambulation.   Pictures obtained:[email or web address]   Post-op Rx sent to pharmacy: Norco, Keflex, Zofran  Patient was provided with the breast reduction and General Surgical Risk consent document and Pain Medication Agreement prior to their appointment.  They had adequate time to read through the risk consent documents and Pain Medication Agreement. We also discussed them in person together during this preop appointment. All of their questions were answered to their satisfaction.  Recommended calling if they have any further questions.  Risk consent form and Pain Medication Agreement to be scanned into patient's chart.  The risk that can be encountered with breast reduction were discussed and include the following but not limited to these:  Breast asymmetry, fluid accumulation, firmness of the breast, inability to breast feed, loss of nipple or areola, skin loss, decrease or no nipple sensation, fat necrosis of the breast tissue, bleeding, infection, healing delay.  There are risks of anesthesia, changes  to skin sensation and injury to nerves or blood vessels.  The muscle can be temporarily or permanently injured.  You may have an allergic reaction to tape, suture, glue, blood products which can result in skin discoloration, swelling, pain, skin lesions, poor healing.  Any of these can lead to the need for revisonal surgery or stage procedures.  A reduction has potential to interfere with diagnostic procedures.  Nipple or breast piercing can increase risks of infection.  This procedure is best done when the breast is fully developed.  Changes in the breast will continue to occur over time.  Pregnancy can alter the outcomes of previous breast reduction surgery, weight gain and weigh loss can also effect the long term appearance.   The risks that can be encountered with and after liposuction were discussed and include the following but no limited to these:  Asymmetry, fluid accumulation, firmness of the area, fat necrosis with death of fat tissue, bleeding, infection, delayed healing, anesthesia risks, skin sensation changes, injury to structures including nerves, blood vessels, and muscles which may be temporary or permanent, allergies to tape, suture materials and glues, blood products, topical preparations or injected agents, skin and contour irregularities, skin discoloration and swelling, deep vein thrombosis, cardiac and pulmonary complications, pain, which may persist, persistent pain, recurrence of the lesion, poor healing of the incision, possible need for revisional surgery or staged procedures. Thiere can also be persistent swelling, poor wound healing, rippling or loose skin, worsening of cellulite, swelling, and thermal burn or heat injury from ultrasound with the ultrasound-assisted lipoplasty technique. Any change in weight fluctuations can alter the outcome.    Electronically signed by: Carola Rhine Colleen Lyall, PA-C 11/15/2019 2:01 PM

## 2019-11-14 NOTE — H&P (View-Only) (Signed)
Patient ID: Colleen Bailey, female    DOB: Sep 15, 1965, 54 y.o.   MRN: [id number]  Chief Complaint  Patient presents with   Pre-op Exam      ICD-10-CM   1. Symptomatic mammary hypertrophy  N62   2. Chronic bilateral thoracic back pain  M54.6    G89.29   3. Neck pain  M54.2      History of Present Illness: Colleen Bailey is a 54 y.o.  female  with a history of macromastia.  She presents for preoperative evaluation for upcoming procedure, Bilateral breast reduction with possible liposuction, scheduled for 11/30/2019 with Dr. Marla Roe  The patient has not had problems with anesthesia. No history of DVT/PE.  No family history of DVT/PE.  No family or personal history of bleeding or clotting disorders.  Patient is not currently taking any blood thinners.  No history of CVA/MI.   Summary of Previous Visit: StN on right is 33cm, StN on left is 31cm, IMF distance is 14cm, Pre op bra size is 40-42 DDD cup, she would like to be around a B or C. Estimated excess tissue to be removed is 350 g on left and 350 g on right.   Recent Mammogram on 06/23/19 - Negative  Job: Operates machinery  PMH Significant for: Anemia History of cholecystectomy  Patient reports she has overall been feeling well lately, she does report that she has had a few deaths in her family which has been difficult for her.  Past Medical History: Allergies: No Known Allergies  Current Medications:  Current Outpatient Medications:    cephALEXin (KEFLEX) 500 MG capsule, Take 1 capsule (500 mg total) by mouth 4 (four) times daily for 3 days., Disp: 12 capsule, Rfl: 0   HYDROcodone-acetaminophen (NORCO) 5-325 MG tablet, Take 1 tablet by mouth every 6 (six) hours as needed for up to 5 days for severe pain., Disp: 20 tablet, Rfl: 0   ibuprofen (ADVIL,MOTRIN) 600 MG tablet, Take 1 tablet (600 mg total) by mouth every 8 (eight) hours as needed. (Patient not taking: Reported on 11/15/2019), Disp: 90 tablet, Rfl: 1    meclizine (ANTIVERT) 25 MG tablet, Take 2 tablets (50 mg total) by mouth 3 (three) times daily as needed for dizziness. (Patient not taking: Reported on 11/15/2019), Disp: 30 tablet, Rfl: 0   ondansetron (ZOFRAN) 4 MG tablet, Take 1 tablet (4 mg total) by mouth every 8 (eight) hours as needed for nausea or vomiting., Disp: 20 tablet, Rfl: 0  Past Medical Problems: Past Medical History:  Diagnosis Date   Anemia 12/27/2012   Elevated BP 03/07/2013   Hyperlipidemia     Past Surgical History: Past Surgical History:  Procedure Laterality Date   CESAREAN SECTION     X2   CHOLECYSTECTOMY  05-05-2013   LITHOTRIPSY     TUBAL LIGATION      Social History: Social History   Socioeconomic History   Marital status: Legally Separated    Spouse name: Not on file   Number of children: 3   Years of education: Not on file   Highest education level: Not on file  Occupational History   Occupation: works at [employer]: [employer]  Tobacco Use   Smoking status: Never Smoker   Smokeless tobacco: Never Used  Scientific laboratory technician Use: Never used  Substance and Sexual Activity   Alcohol use: No    Alcohol/week: 0.0 standard drinks    Comment: rare   Drug use: No  Sexual activity: Not Currently    Comment: 1st intercourse- ?, partners- 3,   Other Topics Concern   Not on file  Social History Narrative   ** Merged History Encounter **       Original from [place] w/ son   Lives in [place] since ~ 2006   Social Determinants of Health   Financial Resource Strain:    Difficulty of Paying Living Expenses: Not on file  Food Insecurity:    Worried About Charity fundraiser in the Last Year: Not on file   [employer] of Food in the Last Year: Not on file  Transportation Needs:    Film/video editor (Medical): Not on file   Lack of Transportation (Non-Medical): Not on file  Physical Activity:    Days of Exercise per Week: Not on file   Minutes  of Exercise per Session: Not on file  Stress:    Feeling of Stress : Not on file  Social Connections:    Frequency of Communication with Friends and Family: Not on file   Frequency of Social Gatherings with Friends and Family: Not on file   Attends Religious Services: Not on file   Active Member of Clubs or Organizations: Not on file   Attends Archivist Meetings: Not on file   Marital Status: Not on file  Intimate Partner Violence:    Fear of Current or Ex-Partner: Not on file   Emotionally Abused: Not on file   Physically Abused: Not on file   Sexually Abused: Not on file    Family History: Family History  Problem Relation Age of Onset   Diabetes Father    Cancer Neg Hx    CAD Neg Hx    Breast cancer Neg Hx     Review of Systems: Review of Systems  Constitutional: Negative.   Eyes: Negative.   Respiratory: Negative.   Cardiovascular: Negative.   Gastrointestinal: Negative.   Genitourinary: Negative.   Musculoskeletal: Negative.   Skin: Negative.     Physical Exam: Vital Signs BP (!) 177/114 (BP Location: Left Arm, Patient Position: Sitting, Cuff Size: Normal)    Pulse 67    Temp 98.7 F (37.1 C) (Oral)    Ht 5' (1.524 m)    Wt 145 lb 3.2 oz (65.9 kg)    LMP 10/23/2015    SpO2 99%    BMI 28.36 kg/m  Physical Exam Constitutional:      General: She is not in acute distress.    Appearance: Normal appearance. She is not ill-appearing.  HENT:     Head: Normocephalic and atraumatic.  Eyes:     Pupils: Pupils are equal, round Neck:     Musculoskeletal: Normal range of motion.  Cardiovascular:     Rate and Rhythm: Normal rate and regular rhythm.     Pulses: Normal pulses.     Heart sounds: Normal heart sounds. No murmur.  Pulmonary:     Effort: Pulmonary effort is normal. No respiratory distress.     Breath sounds: Normal breath sounds. No wheezing.  Abdominal:     General: Abdomen is flat. There is no distension.     Palpations: Abdomen  is soft.     Tenderness: There is no abdominal tenderness.  Musculoskeletal: Normal range of motion.  Skin:    General: Skin is warm and dry.     Findings: No erythema or rash.  Neurological:     General: No focal deficit present.  Mental Status: She is alert and oriented to person, place, and time. Mental status is at baseline.     Motor: No weakness.  Psychiatric:        Mood and Affect: Mood normal.        Behavior: Behavior normal.     Assessment/Plan: The patient is scheduled for bilateral breast reduction with Dr. Marla Roe.  Risks, benefits, and alternatives of procedure discussed, questions answered and consent obtained.    Smoking Status: Non-smoker; Counseling Given?  N/A Last Mammogram: 06/23/2019; Results: Negative  Caprini Score: 4, moderate; Risk Factors include: Age, BMI greater than 25, and length of planned surgery. Recommendation for mechanical during surgery. Encourage early ambulation.   Pictures obtained:[email or web address]   Post-op Rx sent to pharmacy: Norco, Keflex, Zofran  Patient was provided with the breast reduction and General Surgical Risk consent document and Pain Medication Agreement prior to their appointment.  They had adequate time to read through the risk consent documents and Pain Medication Agreement. We also discussed them in person together during this preop appointment. All of their questions were answered to their satisfaction.  Recommended calling if they have any further questions.  Risk consent form and Pain Medication Agreement to be scanned into patient's chart.  The risk that can be encountered with breast reduction were discussed and include the following but not limited to these:  Breast asymmetry, fluid accumulation, firmness of the breast, inability to breast feed, loss of nipple or areola, skin loss, decrease or no nipple sensation, fat necrosis of the breast tissue, bleeding, infection, healing delay.  There are risks of anesthesia, changes  to skin sensation and injury to nerves or blood vessels.  The muscle can be temporarily or permanently injured.  You may have an allergic reaction to tape, suture, glue, blood products which can result in skin discoloration, swelling, pain, skin lesions, poor healing.  Any of these can lead to the need for revisonal surgery or stage procedures.  A reduction has potential to interfere with diagnostic procedures.  Nipple or breast piercing can increase risks of infection.  This procedure is best done when the breast is fully developed.  Changes in the breast will continue to occur over time.  Pregnancy can alter the outcomes of previous breast reduction surgery, weight gain and weigh loss can also effect the long term appearance.   The risks that can be encountered with and after liposuction were discussed and include the following but no limited to these:  Asymmetry, fluid accumulation, firmness of the area, fat necrosis with death of fat tissue, bleeding, infection, delayed healing, anesthesia risks, skin sensation changes, injury to structures including nerves, blood vessels, and muscles which may be temporary or permanent, allergies to tape, suture materials and glues, blood products, topical preparations or injected agents, skin and contour irregularities, skin discoloration and swelling, deep vein thrombosis, cardiac and pulmonary complications, pain, which may persist, persistent pain, recurrence of the lesion, poor healing of the incision, possible need for revisional surgery or staged procedures. Thiere can also be persistent swelling, poor wound healing, rippling or loose skin, worsening of cellulite, swelling, and thermal burn or heat injury from ultrasound with the ultrasound-assisted lipoplasty technique. Any change in weight fluctuations can alter the outcome.    Electronically signed by: Carola Rhine Nyra Anspaugh, PA-C 11/15/2019 2:01 PM

## 2019-11-15 ENCOUNTER — Other Ambulatory Visit: Payer: Self-pay

## 2019-11-15 ENCOUNTER — Ambulatory Visit (INDEPENDENT_AMBULATORY_CARE_PROVIDER_SITE_OTHER): Payer: Managed Care, Other (non HMO) | Admitting: Surgical

## 2019-11-15 ENCOUNTER — Encounter: Payer: Self-pay | Admitting: Surgical

## 2019-11-15 VITALS — BP 177/114 | HR 67 | Temp 98.7°F | Ht 60.0 in | Wt 145.2 lb

## 2019-11-15 DIAGNOSIS — G8929 Other chronic pain: Secondary | ICD-10-CM

## 2019-11-15 DIAGNOSIS — M542 Cervicalgia: Secondary | ICD-10-CM

## 2019-11-15 DIAGNOSIS — M546 Pain in thoracic spine: Secondary | ICD-10-CM

## 2019-11-15 DIAGNOSIS — N62 Hypertrophy of breast: Secondary | ICD-10-CM

## 2019-11-15 MED ORDER — HYDROCODONE-ACETAMINOPHEN 5-325 MG PO TABS: 1.0000 | ORAL_TABLET | Freq: Four times a day (QID) | ORAL | 0 refills | Status: AC | PRN

## 2019-11-15 MED ORDER — CEPHALEXIN 500 MG PO CAPS: 500.0000 mg | ORAL_CAPSULE | Freq: Four times a day (QID) | ORAL | 0 refills | Status: AC

## 2019-11-15 MED ORDER — ONDANSETRON HCL 4 MG PO TABS: 4.0000 mg | ORAL_TABLET | Freq: Three times a day (TID) | ORAL | 0 refills | Status: AC | PRN

## 2019-11-24 ENCOUNTER — Other Ambulatory Visit: Payer: Self-pay

## 2019-11-24 ENCOUNTER — Encounter (HOSPITAL_BASED_OUTPATIENT_CLINIC_OR_DEPARTMENT_OTHER): Payer: Self-pay | Admitting: Plastic Surgery

## 2019-11-28 ENCOUNTER — Other Ambulatory Visit (HOSPITAL_COMMUNITY)
Admission: RE | Admit: 2019-11-28 | Discharge: 2019-11-28 | Disposition: A | Discharge status: Home / Self Care | Payer: Managed Care, Other (non HMO) | Source: Ambulatory Visit | Attending: Plastic Surgery | Admitting: Plastic Surgery

## 2019-11-28 DIAGNOSIS — Z20822 Contact with and (suspected) exposure to covid-19: Secondary | ICD-10-CM | POA: Diagnosis not present

## 2019-11-28 DIAGNOSIS — Z01812 Encounter for preprocedural laboratory examination: Secondary | ICD-10-CM | POA: Insufficient documentation

## 2019-11-28 LAB — SARS CORONAVIRUS 2 (TAT 6-24 HRS): SARS Coronavirus 2: NEGATIVE

## 2019-11-30 ENCOUNTER — Encounter (HOSPITAL_BASED_OUTPATIENT_CLINIC_OR_DEPARTMENT_OTHER)
Admission: RE | Disposition: A | Discharge status: Home / Self Care | Payer: Self-pay | Source: Home / Self Care | Attending: Plastic Surgery

## 2019-11-30 ENCOUNTER — Ambulatory Visit (HOSPITAL_BASED_OUTPATIENT_CLINIC_OR_DEPARTMENT_OTHER): Payer: Managed Care, Other (non HMO) | Admitting: Anesthesiology

## 2019-11-30 ENCOUNTER — Other Ambulatory Visit: Payer: Self-pay

## 2019-11-30 ENCOUNTER — Ambulatory Visit (HOSPITAL_BASED_OUTPATIENT_CLINIC_OR_DEPARTMENT_OTHER)
Admission: RE | Admit: 2019-11-30 | Discharge: 2019-11-30 | Disposition: A | Discharge status: Home / Self Care | Payer: Managed Care, Other (non HMO) | Attending: Plastic Surgery | Admitting: Plastic Surgery

## 2019-11-30 ENCOUNTER — Encounter (HOSPITAL_BASED_OUTPATIENT_CLINIC_OR_DEPARTMENT_OTHER): Payer: Self-pay | Admitting: Plastic Surgery

## 2019-11-30 DIAGNOSIS — M546 Pain in thoracic spine: Secondary | ICD-10-CM | POA: Diagnosis not present

## 2019-11-30 DIAGNOSIS — M549 Dorsalgia, unspecified: Secondary | ICD-10-CM | POA: Diagnosis not present

## 2019-11-30 DIAGNOSIS — Z833 Family history of diabetes mellitus: Secondary | ICD-10-CM | POA: Diagnosis not present

## 2019-11-30 DIAGNOSIS — N62 Hypertrophy of breast: Secondary | ICD-10-CM

## 2019-11-30 DIAGNOSIS — M542 Cervicalgia: Secondary | ICD-10-CM | POA: Insufficient documentation

## 2019-11-30 DIAGNOSIS — I10 Essential (primary) hypertension: Secondary | ICD-10-CM | POA: Diagnosis not present

## 2019-11-30 DIAGNOSIS — G8929 Other chronic pain: Secondary | ICD-10-CM

## 2019-11-30 HISTORY — PX: BREAST REDUCTION SURGERY: SHX8

## 2019-11-30 SURGERY — BREAST REDUCTION WITH LIPOSUCTION
Anesthesia: General | Site: Breast | Laterality: Bilateral

## 2019-11-30 MED ORDER — OXYCODONE HCL 5 MG PO TABS: 5.0000 mg | ORAL_TABLET | Freq: Once | ORAL | Status: DC | PRN

## 2019-11-30 MED ORDER — BUPIVACAINE-EPINEPHRINE 0.25% -1:200000 IJ SOLN: INTRAMUSCULAR | Status: DC | PRN

## 2019-11-30 MED ORDER — SODIUM CHLORIDE 0.9% FLUSH: 3.0000 mL | INTRAVENOUS | Status: DC | PRN

## 2019-11-30 MED ORDER — LIDOCAINE 2% (20 MG/ML) 5 ML SYRINGE
INTRAMUSCULAR | Status: AC
  Filled 2019-11-30: qty 5

## 2019-11-30 MED ORDER — SUCCINYLCHOLINE CHLORIDE 200 MG/10ML IV SOSY
PREFILLED_SYRINGE | INTRAVENOUS | Status: AC
  Filled 2019-11-30: qty 10

## 2019-11-30 MED ORDER — BUPIVACAINE HCL 0.25 % IJ SOLN
INTRAMUSCULAR | Status: DC | PRN
  Administered 2019-11-30: 15 mL

## 2019-11-30 MED ORDER — LACTATED RINGERS IV SOLN
INTRAVENOUS | Status: AC | PRN
  Administered 2019-11-30: 200 mL via INTRAVENOUS

## 2019-11-30 MED ORDER — LIDOCAINE HCL (PF) 1 % IJ SOLN
INTRAMUSCULAR | Status: AC
  Filled 2019-11-30: qty 120

## 2019-11-30 MED ORDER — ACETAMINOPHEN 325 MG RE SUPP: 650.0000 mg | RECTAL | Status: DC | PRN

## 2019-11-30 MED ORDER — OXYCODONE HCL 5 MG PO TABS: 5.0000 mg | ORAL_TABLET | ORAL | Status: DC | PRN

## 2019-11-30 MED ORDER — LACTATED RINGERS IV SOLN: INTRAVENOUS | Status: DC

## 2019-11-30 MED ORDER — PHENYLEPHRINE 40 MCG/ML (10ML) SYRINGE FOR IV PUSH (FOR BLOOD PRESSURE SUPPORT)
PREFILLED_SYRINGE | INTRAVENOUS | Status: AC
  Filled 2019-11-30: qty 10

## 2019-11-30 MED ORDER — MIDAZOLAM HCL 5 MG/5ML IJ SOLN
INTRAMUSCULAR | Status: DC | PRN
  Administered 2019-11-30: 2 mg via INTRAVENOUS

## 2019-11-30 MED ORDER — FENTANYL CITRATE (PF) 100 MCG/2ML IJ SOLN
INTRAMUSCULAR | Status: AC
  Filled 2019-11-30: qty 2

## 2019-11-30 MED ORDER — SUGAMMADEX SODIUM 200 MG/2ML IV SOLN
INTRAVENOUS | Status: DC | PRN
  Administered 2019-11-30: 200 mg via INTRAVENOUS

## 2019-11-30 MED ORDER — DEXAMETHASONE SODIUM PHOSPHATE 10 MG/ML IJ SOLN
INTRAMUSCULAR | Status: AC
  Filled 2019-11-30: qty 1

## 2019-11-30 MED ORDER — MIDAZOLAM HCL 2 MG/2ML IJ SOLN
INTRAMUSCULAR | Status: AC
  Filled 2019-11-30: qty 2

## 2019-11-30 MED ORDER — EPINEPHRINE PF 1 MG/ML IJ SOLN
INTRAMUSCULAR | Status: DC | PRN
  Administered 2019-11-30: 1 mg

## 2019-11-30 MED ORDER — PROPOFOL 10 MG/ML IV BOLUS
INTRAVENOUS | Status: DC | PRN
  Administered 2019-11-30: 200 mg via INTRAVENOUS

## 2019-11-30 MED ORDER — LIDOCAINE-EPINEPHRINE 1 %-1:100000 IJ SOLN
INTRAMUSCULAR | Status: AC
  Filled 2019-11-30: qty 1

## 2019-11-30 MED ORDER — EPHEDRINE 5 MG/ML INJ
INTRAVENOUS | Status: AC
  Filled 2019-11-30: qty 10

## 2019-11-30 MED ORDER — CHLORHEXIDINE GLUCONATE CLOTH 2 % EX PADS: 6.0000 | MEDICATED_PAD | Freq: Once | CUTANEOUS | Status: DC

## 2019-11-30 MED ORDER — CEFAZOLIN SODIUM-DEXTROSE 2-4 GM/100ML-% IV SOLN
2.0000 g | INTRAVENOUS | Status: AC
  Administered 2019-11-30: 2 g via INTRAVENOUS

## 2019-11-30 MED ORDER — ONDANSETRON HCL 4 MG/2ML IJ SOLN
INTRAMUSCULAR | Status: DC | PRN
  Administered 2019-11-30: 4 mg via INTRAVENOUS

## 2019-11-30 MED ORDER — EPHEDRINE SULFATE 50 MG/ML IJ SOLN
INTRAMUSCULAR | Status: DC | PRN
  Administered 2019-11-30: 15 mg via INTRAVENOUS

## 2019-11-30 MED ORDER — FENTANYL CITRATE (PF) 100 MCG/2ML IJ SOLN
25.0000 ug | INTRAMUSCULAR | Status: DC | PRN
  Administered 2019-11-30: 25 ug via INTRAVENOUS

## 2019-11-30 MED ORDER — ROCURONIUM BROMIDE 100 MG/10ML IV SOLN
INTRAVENOUS | Status: DC | PRN
  Administered 2019-11-30: 70 mg via INTRAVENOUS

## 2019-11-30 MED ORDER — PROPOFOL 500 MG/50ML IV EMUL
INTRAVENOUS | Status: AC
  Filled 2019-11-30: qty 50

## 2019-11-30 MED ORDER — LIDOCAINE-EPINEPHRINE 1 %-1:100000 IJ SOLN
INTRAMUSCULAR | Status: DC | PRN
  Administered 2019-11-30: 15 mL

## 2019-11-30 MED ORDER — KETOROLAC TROMETHAMINE 30 MG/ML IJ SOLN: 30.0000 mg | Freq: Once | INTRAMUSCULAR | Status: DC | PRN

## 2019-11-30 MED ORDER — DIPHENHYDRAMINE HCL 50 MG/ML IJ SOLN
INTRAMUSCULAR | Status: DC | PRN
  Administered 2019-11-30: 12.5 mg via INTRAVENOUS

## 2019-11-30 MED ORDER — DIPHENHYDRAMINE HCL 50 MG/ML IJ SOLN
INTRAMUSCULAR | Status: AC
  Filled 2019-11-30: qty 1

## 2019-11-30 MED ORDER — SODIUM CHLORIDE 0.9% FLUSH: 3.0000 mL | Freq: Two times a day (BID) | INTRAVENOUS | Status: DC

## 2019-11-30 MED ORDER — OXYCODONE HCL 5 MG/5ML PO SOLN: 5.0000 mg | Freq: Once | ORAL | Status: DC | PRN

## 2019-11-30 MED ORDER — DEXAMETHASONE SODIUM PHOSPHATE 4 MG/ML IJ SOLN
INTRAMUSCULAR | Status: DC | PRN
  Administered 2019-11-30: 10 mg via INTRAVENOUS

## 2019-11-30 MED ORDER — ONDANSETRON HCL 4 MG/2ML IJ SOLN: 4.0000 mg | Freq: Once | INTRAMUSCULAR | Status: DC | PRN

## 2019-11-30 MED ORDER — LIDOCAINE HCL (CARDIAC) PF 100 MG/5ML IV SOSY
PREFILLED_SYRINGE | INTRAVENOUS | Status: DC | PRN
  Administered 2019-11-30: 100 mg via INTRAVENOUS

## 2019-11-30 MED ORDER — FENTANYL CITRATE (PF) 100 MCG/2ML IJ SOLN
INTRAMUSCULAR | Status: DC | PRN
  Administered 2019-11-30: 50 ug via INTRAVENOUS
  Administered 2019-11-30: 100 ug via INTRAVENOUS

## 2019-11-30 MED ORDER — EPINEPHRINE PF 1 MG/ML IJ SOLN
INTRAMUSCULAR | Status: AC
  Filled 2019-11-30: qty 1

## 2019-11-30 MED ORDER — HYDROMORPHONE HCL 1 MG/ML IJ SOLN: 0.2500 mg | INTRAMUSCULAR | Status: DC | PRN

## 2019-11-30 MED ORDER — CEFAZOLIN SODIUM-DEXTROSE 2-4 GM/100ML-% IV SOLN
INTRAVENOUS | Status: AC
  Filled 2019-11-30: qty 100

## 2019-11-30 MED ORDER — BUPIVACAINE HCL (PF) 0.25 % IJ SOLN
INTRAMUSCULAR | Status: AC
  Filled 2019-11-30: qty 30

## 2019-11-30 MED ORDER — LIDOCAINE HCL 1 % IJ SOLN
INTRAMUSCULAR | Status: DC | PRN
  Administered 2019-11-30: 50 mL

## 2019-11-30 MED ORDER — ACETAMINOPHEN 325 MG PO TABS: 650.0000 mg | ORAL_TABLET | ORAL | Status: DC | PRN

## 2019-11-30 MED ORDER — ONDANSETRON HCL 4 MG/2ML IJ SOLN
INTRAMUSCULAR | Status: AC
  Filled 2019-11-30: qty 2

## 2019-11-30 MED ORDER — SODIUM CHLORIDE 0.9 % IV SOLN: 250.0000 mL | INTRAVENOUS | Status: DC | PRN

## 2019-11-30 SURGICAL SUPPLY — 71 items
ADH SKN CLS APL DERMABOND .7 (GAUZE/BANDAGES/DRESSINGS)
BAG DECANTER FOR FLEXI CONT (MISCELLANEOUS) ×3 IMPLANT
BINDER BREAST LRG (GAUZE/BANDAGES/DRESSINGS) IMPLANT
BINDER BREAST MEDIUM (GAUZE/BANDAGES/DRESSINGS) IMPLANT
BINDER BREAST XLRG (GAUZE/BANDAGES/DRESSINGS) IMPLANT
BINDER BREAST XXLRG (GAUZE/BANDAGES/DRESSINGS) IMPLANT
BIOPATCH RED 1 DISK 7.0 (GAUZE/BANDAGES/DRESSINGS) IMPLANT
BIOPATCH RED 1IN DISK 7.0MM (GAUZE/BANDAGES/DRESSINGS)
BLADE HEX COATED 2.75 (ELECTRODE) ×3 IMPLANT
BLADE KNIFE PERSONA 10 (BLADE) ×6 IMPLANT
BLADE SURG 15 STRL LF DISP TIS (BLADE) IMPLANT
BLADE SURG 15 STRL SS (BLADE)
BNDG GAUZE ELAST 4 BULKY (GAUZE/BANDAGES/DRESSINGS) IMPLANT
CANISTER SUCT 1200ML W/VALVE (MISCELLANEOUS) ×3 IMPLANT
COVER BACK TABLE 60X90IN (DRAPES) ×3 IMPLANT
COVER MAYO STAND STRL (DRAPES) ×3 IMPLANT
COVER WAND RF STERILE (DRAPES) IMPLANT
DECANTER SPIKE VIAL GLASS SM (MISCELLANEOUS) IMPLANT
DERMABOND ADVANCED (GAUZE/BANDAGES/DRESSINGS)
DERMABOND ADVANCED .7 DNX12 (GAUZE/BANDAGES/DRESSINGS) IMPLANT
DRAIN CHANNEL 19F RND (DRAIN) IMPLANT
DRAPE LAPAROSCOPIC ABDOMINAL (DRAPES) ×3 IMPLANT
DRSG OPSITE POSTOP 4X6 (GAUZE/BANDAGES/DRESSINGS) ×3 IMPLANT
DRSG PAD ABDOMINAL 8X10 ST (GAUZE/BANDAGES/DRESSINGS) ×6 IMPLANT
ELECT BLADE 4.0 EZ CLEAN MEGAD (MISCELLANEOUS) ×3
ELECT REM PT RETURN 9FT ADLT (ELECTROSURGICAL) ×3
ELECTRODE BLDE 4.0 EZ CLN MEGD (MISCELLANEOUS) IMPLANT
ELECTRODE REM PT RTRN 9FT ADLT (ELECTROSURGICAL) ×1 IMPLANT
EVACUATOR SILICONE 100CC (DRAIN) IMPLANT
GLOVE BIO SURGEON STRL SZ 6.5 (GLOVE) ×8 IMPLANT
GLOVE BIO SURGEONS STRL SZ 6.5 (GLOVE) ×6
GLOVE SURG SS PI 6.5 STRL IVOR (GLOVE) ×4 IMPLANT
GLOVE SURG UNDER POLY LF SZ7 (GLOVE) ×4 IMPLANT
GOWN STRL REUS W/ TWL LRG LVL3 (GOWN DISPOSABLE) ×2 IMPLANT
GOWN STRL REUS W/TWL LRG LVL3 (GOWN DISPOSABLE) ×12
NDL HYPO 25X1 1.5 SAFETY (NEEDLE) ×1 IMPLANT
NDL SAFETY ECLIPSE 18X1.5 (NEEDLE) IMPLANT
NEEDLE HYPO 18GX1.5 SHARP (NEEDLE)
NEEDLE HYPO 25X1 1.5 SAFETY (NEEDLE) IMPLANT
NS IRRIG 1000ML POUR BTL (IV SOLUTION) ×3 IMPLANT
PACK BASIN DAY SURGERY FS (CUSTOM PROCEDURE TRAY) ×3 IMPLANT
PAD ALCOHOL SWAB (MISCELLANEOUS) IMPLANT
PAD FOAM SILICONE BACKED (GAUZE/BANDAGES/DRESSINGS) IMPLANT
PENCIL SMOKE EVACUATOR (MISCELLANEOUS) ×3 IMPLANT
SLEEVE SCD COMPRESS KNEE MED (MISCELLANEOUS) ×3 IMPLANT
SPONGE LAP 18X18 RF (DISPOSABLE) ×6 IMPLANT
STRIP SUTURE WOUND CLOSURE 1/2 (MISCELLANEOUS) ×6 IMPLANT
SUT MNCRL AB 4-0 PS2 18 (SUTURE) ×24 IMPLANT
SUT MON AB 3-0 SH 27 (SUTURE) ×9
SUT MON AB 3-0 SH27 (SUTURE) ×1 IMPLANT
SUT MON AB 5-0 PS2 18 (SUTURE) ×14 IMPLANT
SUT PDS 3-0 CT2 (SUTURE) ×6
SUT PDS AB 2-0 CT2 27 (SUTURE) ×2 IMPLANT
SUT PDS II 3-0 CT2 27 ABS (SUTURE) IMPLANT
SUT SILK 3 0 PS 1 (SUTURE) IMPLANT
SUT VIC AB 3-0 SH 27 (SUTURE)
SUT VIC AB 3-0 SH 27X BRD (SUTURE) IMPLANT
SUT VICRYL 4-0 PS2 18IN ABS (SUTURE) IMPLANT
SYR 3ML 23GX1 SAFETY (SYRINGE) ×3 IMPLANT
SYR 50ML LL SCALE MARK (SYRINGE) IMPLANT
SYR BULB IRRIG 60ML STRL (SYRINGE) ×3 IMPLANT
SYR CONTROL 10ML LL (SYRINGE) ×3 IMPLANT
TAPE MEASURE VINYL STERILE (MISCELLANEOUS) IMPLANT
TOWEL GREEN STERILE FF (TOWEL DISPOSABLE) ×6 IMPLANT
TRAY DSU PREP LF (CUSTOM PROCEDURE TRAY) ×3 IMPLANT
TUBE CONNECTING 20'X1/4 (TUBING) ×1
TUBE CONNECTING 20X1/4 (TUBING) ×2 IMPLANT
TUBING INFILTRATION IT-10001 (TUBING) ×3 IMPLANT
TUBING SET GRADUATE ASPIR 12FT (MISCELLANEOUS) IMPLANT
UNDERPAD 30X36 HEAVY ABSORB (UNDERPADS AND DIAPERS) ×6 IMPLANT
YANKAUER SUCT BULB TIP NO VENT (SUCTIONS) ×3 IMPLANT

## 2019-11-30 NOTE — Anesthesia Preprocedure Evaluation (Signed)
Anesthesia Evaluation  Patient identified by MRN, date of birth, ID band Patient awake    Reviewed: Allergy & Precautions, NPO status , Patient's Chart, lab work & pertinent test results  Airway Mallampati: II  TM Distance: >3 FB Neck ROM: Full    Dental no notable dental hx.    Pulmonary neg pulmonary ROS,    Pulmonary exam normal breath sounds clear to auscultation       Cardiovascular hypertension, Normal cardiovascular exam Rhythm:Regular Rate:Normal  Untreated HTN   Neuro/Psych negative neurological ROS  negative psych ROS   GI/Hepatic negative GI ROS, Neg liver ROS,   Endo/Other  negative endocrine ROS  Renal/GU negative Renal ROS  negative genitourinary   Musculoskeletal negative musculoskeletal ROS (+)   Abdominal   Peds negative pediatric ROS (+)  Hematology negative hematology ROS (+)   Anesthesia Other Findings   Reproductive/Obstetrics negative OB ROS                             Anesthesia Physical Anesthesia Plan  ASA: II  Anesthesia Plan: General   Post-op Pain Management:    Induction: Intravenous  PONV Risk Score and Plan: 3 and Ondansetron, Dexamethasone, Midazolam and Treatment may vary due to age or medical condition  Airway Management Planned: Oral ETT  Additional Equipment:   Intra-op Plan:   Post-operative Plan: Extubation in OR  Informed Consent: I have reviewed the patients History and Physical, chart, labs and discussed the procedure including the risks, benefits and alternatives for the proposed anesthesia with the patient or authorized representative who has indicated his/her understanding and acceptance.     Dental advisory given  Plan Discussed with: CRNA and Surgeon  Anesthesia Plan Comments:         Anesthesia Quick Evaluation

## 2019-11-30 NOTE — Interval H&P Note (Signed)
History and Physical Interval Note:  11/30/2019 7:46 AM  Colleen Bailey  has presented today for surgery, with the diagnosis of mammary hypertrophy.  The various methods of treatment have been discussed with the patient and family. After consideration of risks, benefits and other options for treatment, the patient has consented to  Procedure(s): BREAST REDUCTION WITH LIPOSUCTION (Bilateral) as a surgical intervention.  The patient's history has been reviewed, patient examined, no change in status, stable for surgery.  I have reviewed the patient's chart and labs.  Questions were answered to the patient's satisfaction.     Loel Lofty Jaye Polidori

## 2019-11-30 NOTE — Anesthesia Postprocedure Evaluation (Signed)
Anesthesia Post Note  Patient: Colleen Bailey  Procedure(s) Performed: BREAST REDUCTION WITH LIPOSUCTION (Bilateral Breast)     Patient location during evaluation: PACU Anesthesia Type: General Level of consciousness: awake and alert Pain management: pain level controlled Vital Signs Assessment: post-procedure vital signs reviewed and stable Respiratory status: spontaneous breathing, nonlabored ventilation, respiratory function stable and patient connected to nasal cannula oxygen Cardiovascular status: blood pressure returned to baseline and stable Postop Assessment: no apparent nausea or vomiting Anesthetic complications: no   No complications documented.  Last Vitals:  Vitals:   11/30/19 1132 11/30/19 1145  BP: 119/78 125/84  Pulse: 87 90  Resp: 17 16  Temp:    SpO2: 93% 94%    Last Pain:  Vitals:   11/30/19 1145  TempSrc:   PainSc: 4                  Maretta Overdorf S

## 2019-11-30 NOTE — Op Note (Signed)
Breast Reduction Op note:    DATE OF PROCEDURE: 11/30/2019  LOCATION: [place]  SURGEON: Lyndee Leo Sanger Ormand Senn, DO  ASSISTANT: Roetta Sessions, PA  PREOPERATIVE DIAGNOSIS 1. Macromastia 2. Neck Pain 3. Back Pain  POSTOPERATIVE DIAGNOSIS 1. Macromastia 2. Neck Pain 3. Back Pain  PROCEDURES 1. Bilateral breast reduction.  Right reduction 554 g, Left reduction 956 g  COMPLICATIONS: None.  DRAINS: none  INDICATIONS FOR PROCEDURE Colleen Bailey is a 54 y.o. year-old female born on April 08, 1965,with a history of symptomatic macromastia with concominant back pain, neck pain, shoulder grooving from her bra.   MRN: [id number]  CONSENT Informed consent was obtained directly from the patient. The risks, benefits and alternatives were fully discussed. Specific risks including but not limited to bleeding, infection, hematoma, seroma, scarring, pain, nipple necrosis, asymmetry, poor cosmetic results, and need for further surgery were discussed. The patient had ample opportunity to have her questions answered to her satisfaction.  DESCRIPTION OF PROCEDURE  Patient was brought into the operating room and placed in a supine position.  SCDs were placed and appropriate padding was performed.  Antibiotics were given. The patient underwent general anesthesia and the chest was prepped and draped in a sterile fashion.  A timeout was performed and all information was confirmed to be correct. Tumescent was placed in the inferior lateral aspect of both breasts. Liposuction was performed in this same area.  Right side: Preoperative markings were confirmed.  Incision lines were injected with 1% Xylocaine with epinephrine.  After waiting for vasoconstriction, the marked lines were incised.  A Wise-pattern superomedial breast reduction was performed by de-epithelializing the pedicle, using bovie to create the superomedial pedicle, and removing breast tissue from the superior,  lateral, and inferior portions of the breast.  Care was taken to not undermine the breast pedicle. Hemostasis was achieved.  The nipple was gently rotated into position and the soft tissue closed with 4-0 Monocryl.   The pocket was irrigated and hemostasis confirmed.  The deep tissues were approximated with 3-0 Monocryl sutures and the skin was closed with deep dermal and subcuticular 4-0 Monocryl sutures.  The nipple and skin flaps had good capillary refill at the end of the procedure.    Left side: Preoperative markings were confirmed.  Incision lines were injected with 1% Xylocaine with epinephrine.  After waiting for vasoconstriction, the marked lines were incised.  A Wise-pattern superomedial breast reduction was performed by de-epithelializing the pedicle, using bovie to create the superomedial pedicle, and removing breast tissue from the superior, lateral, and inferior portions of the breast.  Care was taken to not undermine the breast pedicle. Hemostasis was achieved.  The nipple was gently rotated into position and the soft tissue was closed with 4-0 Monocryl.  The patient was sat upright and size and shape symmetry was confirmed.  The pocket was irrigated and hemostasis confirmed.  The deep tissues were approximated with 3-0 Monocryl sutures and the skin was closed with deep dermal and subcuticular 4-0 Monocryl sutures.  Dermabond was applied.  A breast binder and ABDs were placed.  The nipple and skin flaps had good capillary refill at the end of the procedure.  The patient tolerated the procedure well. The patient was allowed to wake from anesthesia and taken to the recovery room in satisfactory condition.  The advanced practice practitioner (APP) assisted throughout the case.  The APP was essential in retraction and counter traction when needed to make the case progress smoothly.  This retraction and  assistance made it possible to see the tissue plans for the procedure.  The assistance was needed  for blood control, tissue re-approximation and assisted with closure of the incision site.

## 2019-11-30 NOTE — Anesthesia Procedure Notes (Signed)

## 2019-11-30 NOTE — Transfer of Care (Signed)
Immediate Anesthesia Transfer of Care Note  Patient: Colleen Bailey  Procedure(s) Performed: BREAST REDUCTION WITH LIPOSUCTION (Bilateral Breast)  Patient Location: PACU  Anesthesia Type:General  Level of Consciousness: awake, alert , oriented, drowsy and patient cooperative  Airway & Oxygen Therapy: Patient Spontanous Breathing and Patient connected to face mask oxygen  Post-op Assessment: Report given to RN and Post -op Vital signs reviewed and stable  Post vital signs: Reviewed and stable  Last Vitals:  Vitals Value Taken Time  BP    Temp    Pulse    Resp    SpO2      Last Pain:  Vitals:   11/30/19 0718  TempSrc: Oral  PainSc: 0-No pain         Complications: No complications documented.

## 2019-11-30 NOTE — Discharge Instructions (Signed)

## 2019-12-01 ENCOUNTER — Encounter (HOSPITAL_BASED_OUTPATIENT_CLINIC_OR_DEPARTMENT_OTHER): Payer: Self-pay | Admitting: Plastic Surgery

## 2019-12-01 LAB — SURGICAL PATHOLOGY

## 2019-12-05 ENCOUNTER — Telehealth: Payer: Self-pay | Admitting: Surgical

## 2019-12-05 NOTE — Telephone Encounter (Signed)
Patient called to see if she can drive to her appt as she has surgery recently. Please call to advise if she is cleared to drive herself.

## 2019-12-05 NOTE — Telephone Encounter (Signed)
Called and spoke with the patient regarding the message below.  Spoke with Rodman Key, PA-C-and informed the patient that we recommend someone else brings her for the first appointment after surgery.  In case she feels sick or don't feel good during or after the appointment.  Also make sure she's not taking any pain medication, and make sure she's able to react to traffic.  Patient verbalized understanding.  She stated that she's not taking any pain meds and she feels she can drive without any problems, and does not have anyone else to bring her to her appointment.//AB/CMA

## 2019-12-06 ENCOUNTER — Ambulatory Visit (INDEPENDENT_AMBULATORY_CARE_PROVIDER_SITE_OTHER): Payer: Managed Care, Other (non HMO) | Admitting: Plastic Surgery

## 2019-12-06 ENCOUNTER — Other Ambulatory Visit: Payer: Self-pay

## 2019-12-06 ENCOUNTER — Encounter: Payer: Self-pay | Admitting: Plastic Surgery

## 2019-12-06 DIAGNOSIS — N62 Hypertrophy of breast: Secondary | ICD-10-CM

## 2019-12-06 NOTE — Progress Notes (Signed)
The patient is a 54 year old female here for follow-up after undergoing bilateral breast reduction 1 week ago. She has significant bruising and swelling as expected. Nothing looks concerning overall she is doing very well and tolerating food without difficulty. She is moving her bowels and her pain is well controlled. Her incisions are intact. No sign of infection. Continue with the binder. She can go into a sports bra just make sure it is not too tight. She is fine to shower and we will see her back in 2 weeks.

## 2019-12-23 ENCOUNTER — Ambulatory Visit (INDEPENDENT_AMBULATORY_CARE_PROVIDER_SITE_OTHER): Payer: Managed Care, Other (non HMO) | Admitting: Surgical

## 2019-12-23 ENCOUNTER — Encounter: Payer: Self-pay | Admitting: Surgical

## 2019-12-23 ENCOUNTER — Other Ambulatory Visit: Payer: Self-pay

## 2019-12-23 VITALS — BP 154/93 | HR 75 | Temp 98.8°F

## 2019-12-23 DIAGNOSIS — Z9889 Other specified postprocedural states: Secondary | ICD-10-CM

## 2019-12-23 NOTE — Progress Notes (Signed)
Patient is a 54 year old female here for follow-up after breast reduction with Dr. Marla Roe on 11/30/2019.  Patient's bruising has resolved. She reports that she has some lateral bilateral breast pain. She is otherwise doing well.  Chaperone present on exam On exam bilateral NAC's are viable with good color. Bilateral breast incisions intact. She does have positive fluid wave in bilateral breasts. No wounds noted.   I discussed options with patient for the bilateral breast swelling. I recommended either needle aspiration using a butterfly needle today in the office or wearing a more compressive garment to help decrease fluid accumulation in either breast. After our discussion, patient would like to hold off on needle aspiration and would like to allow swelling to try to resolve on its own. I provided patient with an Ace wrap to wrap around her bilateral breasts for compression as her sports bra was not providing much compression.  Honeycomb dressing was removed. Recommend removing Steri-Strips in the next 1 to 2 weeks while she is showering. Recommend calling with any questions or concerns. There is no sign of infection or hematoma.

## 2020-01-05 ENCOUNTER — Encounter: Payer: Self-pay | Admitting: Surgical

## 2020-01-09 ENCOUNTER — Encounter: Payer: Self-pay | Admitting: Surgical

## 2020-01-09 ENCOUNTER — Ambulatory Visit (INDEPENDENT_AMBULATORY_CARE_PROVIDER_SITE_OTHER): Payer: Managed Care, Other (non HMO) | Admitting: Surgical

## 2020-01-09 ENCOUNTER — Other Ambulatory Visit: Payer: Self-pay

## 2020-01-09 VITALS — BP 155/91 | HR 70 | Temp 98.9°F

## 2020-01-09 DIAGNOSIS — Z9889 Other specified postprocedural states: Secondary | ICD-10-CM

## 2020-01-09 NOTE — Progress Notes (Signed)
Patient is a 54 year old female here for follow-up after bilateral breast reduction on 11/30/2019 with Dr. Ulice Bold.  Patient reports that she is doing well.  She has no complaints.  She still has her Steri-Strips in place.  She reports that she is ready to return to work.  Chaperone present on exam On exam bilateral NAC's are viable, good color, bilateral breast incisions intact.  Bilateral breasts with Steri-Strips in place.  No erythema noted.  No swelling noted.  Provided patient with return to work letter, no restrictions at this time. Activities limited by pain only.  Increase activity slowly. Recommend continue wear sports bra for 3 months.  After 3 months she can transition to wearing a normal bra without an underwire.  There is no sign of infection, seroma, hematoma.  Picture was obtained to the patient with patient consent placed in the patient's chart.  Recommend following up in 2 months for reevaluation

## 2020-03-14 ENCOUNTER — Ambulatory Visit: Payer: Managed Care, Other (non HMO) | Admitting: Plastic Surgery

## 2020-04-03 ENCOUNTER — Other Ambulatory Visit: Payer: Self-pay

## 2020-04-03 ENCOUNTER — Encounter: Payer: Self-pay | Admitting: Surgical

## 2020-04-03 ENCOUNTER — Ambulatory Visit (INDEPENDENT_AMBULATORY_CARE_PROVIDER_SITE_OTHER): Payer: Managed Care, Other (non HMO) | Admitting: Surgical

## 2020-04-03 VITALS — Ht 59.0 in | Wt 143.0 lb

## 2020-04-03 DIAGNOSIS — Z9889 Other specified postprocedural states: Secondary | ICD-10-CM

## 2020-04-03 NOTE — Progress Notes (Signed)
   Referring Provider College, [facility] [place] [address],  [place] [address]   CC:  Chief Complaint  Patient presents with  . Follow-up      Colleen Bailey is an 55 y.o. female.  HPI: Patient is a 55 year old female here for follow-up after bilateral breast reduction on 11/30/2019 with Dr. Marla Roe.  She is just over 4 months postop.  She reports today that she is overall doing well, she has some tenderness of the bilateral inframammary fold areas.  She reports overall work is going well, she has been very busy.  Physical Exam Vitals with BMI 04/03/2020 01/09/2020 12/23/2019  Height 4\' 11"  - -  Weight 143 lbs - -  BMI 32.20 - -  Systolic - 254 270  Diastolic - 91 93  Pulse - 70 75    General:  No acute distress,  Alert and oriented, Non-Toxic, Normal speech and affect Breasts: Bilateral NAC's are viable, bilateral breast incisions are well-healed.  She has a 3 mm area of fat necrosis at the most distal portion of the vertical limb of the left breast.  It is mildly tender to palpation.  No overlying skin changes.  Bilateral breasts are symmetric, soft, no fluid wave or subcutaneous fluid palpated.  No areas of large fat necrosis noted throughout either breast.  Assessment/Plan  Recommend light massage to area of fat necrosis of the left breast, recommend Tylenol or ibuprofen as needed for tenderness.  We discussed scheduling additional follow-up for reevaluation or following up on an as-needed basis, patient is comfortable following up on an as-needed basis and will be sure to call us with any questions or concerns.  I discussed with her that overall everything appears to be healing well, I see no signs of infection, seroma, hematoma.  Colleen Bailey 04/03/2020, 3:26 PM

## 2020-08-09 ENCOUNTER — Other Ambulatory Visit: Payer: Self-pay | Admitting: Physician Assistant

## 2020-08-09 DIAGNOSIS — Z1231 Encounter for screening mammogram for malignant neoplasm of breast: Secondary | ICD-10-CM

## 2020-10-19 ENCOUNTER — Inpatient Hospital Stay: Admission: RE | Admit: 2020-10-19 | Payer: Managed Care, Other (non HMO) | Source: Ambulatory Visit

## 2021-01-17 ENCOUNTER — Ambulatory Visit
Admission: RE | Admit: 2021-01-17 | Discharge: 2021-01-17 | Disposition: A | Discharge status: Home / Self Care | Payer: Managed Care, Other (non HMO) | Source: Ambulatory Visit | Attending: Physician Assistant | Admitting: Physician Assistant

## 2021-01-17 DIAGNOSIS — Z1231 Encounter for screening mammogram for malignant neoplasm of breast: Secondary | ICD-10-CM

## 2021-01-24 ENCOUNTER — Other Ambulatory Visit: Payer: Self-pay | Admitting: Physician Assistant

## 2021-01-24 ENCOUNTER — Other Ambulatory Visit: Payer: Self-pay | Admitting: *Deleted

## 2021-01-24 DIAGNOSIS — R928 Other abnormal and inconclusive findings on diagnostic imaging of breast: Secondary | ICD-10-CM

## 2021-02-08 ENCOUNTER — Ambulatory Visit: Payer: Managed Care, Other (non HMO)

## 2021-02-08 ENCOUNTER — Ambulatory Visit
Admission: RE | Admit: 2021-02-08 | Discharge: 2021-02-08 | Disposition: A | Discharge status: Home / Self Care | Payer: Managed Care, Other (non HMO) | Source: Ambulatory Visit | Attending: Physician Assistant | Admitting: Physician Assistant

## 2021-02-08 ENCOUNTER — Other Ambulatory Visit: Payer: Self-pay | Admitting: Physician Assistant

## 2021-02-08 DIAGNOSIS — R928 Other abnormal and inconclusive findings on diagnostic imaging of breast: Secondary | ICD-10-CM

## 2021-02-22 ENCOUNTER — Ambulatory Visit
Admission: RE | Admit: 2021-02-22 | Discharge: 2021-02-22 | Disposition: A | Discharge status: Home / Self Care | Payer: Managed Care, Other (non HMO) | Source: Ambulatory Visit | Attending: Physician Assistant | Admitting: Physician Assistant

## 2021-02-22 DIAGNOSIS — R928 Other abnormal and inconclusive findings on diagnostic imaging of breast: Secondary | ICD-10-CM

## 2021-02-27 ENCOUNTER — Other Ambulatory Visit: Payer: Managed Care, Other (non HMO)

## 2021-07-18 ENCOUNTER — Ambulatory Visit: Payer: Managed Care, Other (non HMO) | Admitting: Neurology

## 2021-07-18 ENCOUNTER — Encounter: Payer: Self-pay | Admitting: Neurology

## 2022-02-07 ENCOUNTER — Emergency Department (HOSPITAL_COMMUNITY)
Admission: EM | Admit: 2022-02-07 | Discharge: 2022-02-07 | Disposition: A | Discharge status: Home / Self Care | Payer: Managed Care, Other (non HMO) | Attending: Emergency Medicine | Admitting: Emergency Medicine

## 2022-02-07 ENCOUNTER — Emergency Department (HOSPITAL_COMMUNITY): Payer: Managed Care, Other (non HMO)

## 2022-02-07 DIAGNOSIS — W010XXA Fall on same level from slipping, tripping and stumbling without subsequent striking against object, initial encounter: Secondary | ICD-10-CM | POA: Insufficient documentation

## 2022-02-07 DIAGNOSIS — R030 Elevated blood-pressure reading, without diagnosis of hypertension: Secondary | ICD-10-CM | POA: Insufficient documentation

## 2022-02-07 DIAGNOSIS — M25511 Pain in right shoulder: Secondary | ICD-10-CM | POA: Diagnosis present

## 2022-02-07 DIAGNOSIS — S43401A Unspecified sprain of right shoulder joint, initial encounter: Secondary | ICD-10-CM | POA: Diagnosis not present

## 2022-02-07 MED ORDER — TRAMADOL HCL 50 MG PO TABS: 50.0000 mg | ORAL_TABLET | Freq: Four times a day (QID) | ORAL | 0 refills | Status: AC | PRN

## 2022-02-07 MED ORDER — ACETAMINOPHEN 500 MG PO TABS
1000.0000 mg | ORAL_TABLET | Freq: Once | ORAL | Status: AC
  Administered 2022-02-07: 1000 mg via ORAL
  Filled 2022-02-07: qty 2

## 2022-02-07 NOTE — Discharge Instructions (Signed)
It was our pleasure to provide your ER care today - we hope that you feel better.  Take acetaminophen or ibuprofen as need for pain.  You may also take ultram as need for pain - no driving when taking.   Wear shoulder sling for comfort/support for the next few days, as need.   Follow up with orthopedist in the next 1-2 weeks if symptoms fail to improve/resolve.  Your blood pressure is mildly high today  - follow up with primary care doctor in the next couple weeks.   Return to ER if worse, new symptoms, new/severe pain, numbness/weakness, or other concern.

## 2022-02-07 NOTE — ED Notes (Signed)
Patient transported to X-ray 

## 2022-02-07 NOTE — ED Triage Notes (Signed)
Patient here for evaluation of right shoulder pain after slipping in wet grass last night and fell on her right side. Patient denies LOC, is alert, oriented, and ambulating independently with steady gait.

## 2022-02-07 NOTE — ED Provider Notes (Signed)
[place] [facility] Provider Note   CSN: [id number] Arrival date & time: 02/07/22  0932     History  Chief Complaint  Patient presents with   Lytle Michaels    Colleen Bailey is a 57 y.o. female.  Pt c/o slip and fall on wet grass last evening. C/o pain to right shoulder post fall, constant, dull, moderate, non radiating. Denies loc or head injury. No headache. No anticoagulant use. Denies neck or back pain. No chest pain or sob. No abd pain or nv. No other extremity pain/injury. Skin intact. Pt drove self to ED.  Took ibuprofen earlier with mild relief.   The history is provided by the patient and medical records.  Fall Pertinent negatives include no chest pain, no abdominal pain, no headaches and no shortness of breath.       Home Medications Prior to Admission medications   Medication Sig Start Date End Date Taking? Authorizing Provider  ondansetron (ZOFRAN) 4 MG tablet Take 1 tablet (4 mg total) by mouth every 8 (eight) hours as needed for nausea or vomiting. 11/15/19   Scheeler, Carola Rhine, PA-C      Allergies    Patient has no known allergies.    Review of Systems   Review of Systems  Constitutional:  Negative for fever.  Respiratory:  Negative for shortness of breath.   Cardiovascular:  Negative for chest pain.  Gastrointestinal:  Negative for abdominal pain, nausea and vomiting.  Musculoskeletal:  Negative for back pain and neck pain.  Skin:  Negative for wound.  Neurological:  Negative for weakness, numbness and headaches.  Hematological:  Does not bruise/bleed easily.    Physical Exam Updated Vital Signs BP (!) 149/83 (BP Location: Left Arm)   Pulse 77   Temp 98.6 F (37 C)   Resp 16   LMP 10/23/2015 Comment: tubal ligation  SpO2 100%  Physical Exam Vitals and nursing note reviewed.  Constitutional:      Appearance: Normal appearance. She is well-developed.  HENT:     Head: Atraumatic.     Nose: Nose normal.      Mouth/Throat:     Mouth: Mucous membranes are moist.  Eyes:     General: No scleral icterus.    Conjunctiva/sclera: Conjunctivae normal.     Pupils: Pupils are equal, round, and reactive to light.  Neck:     Trachea: No tracheal deviation.  Cardiovascular:     Rate and Rhythm: Normal rate and regular rhythm.     Pulses: Normal pulses.     Heart sounds: Normal heart sounds. No murmur heard.    No friction rub. No gallop.  Pulmonary:     Effort: Pulmonary effort is normal. No respiratory distress.     Breath sounds: Normal breath sounds.  Chest:     Chest wall: No tenderness.  Abdominal:     General: There is no distension.     Palpations: Abdomen is soft.     Tenderness: There is no abdominal tenderness.  Musculoskeletal:     Cervical back: Normal range of motion and neck supple. No rigidity or tenderness. No muscular tenderness.     Comments: No deformity to right shoulder noted. +tenderness to shoulder. No severe sts noted. Good passive rom at shoulder, w some pain, good rom righ elbow and wrist without pain or other focal bony tenderness. Radial pulse 2+. No other focal pain or tenderness on  bil ext exam.   Skin:    General:  Skin is warm and dry.     Findings: No rash.  Neurological:     Mental Status: She is alert.     Comments: Alert, speech normal. GCS 15. Motor/sens grossly intact bil. RUE nvi w intact motor/sens fxn.   Psychiatric:        Mood and Affect: Mood normal.     ED Results / Procedures / Treatments   Labs (all labs ordered are listed, but only abnormal results are displayed) Labs Reviewed - No data to display  EKG None  Radiology DG Shoulder Right  Result Date: 02/07/2022 CLINICAL DATA:  fall EXAM: RIGHT SHOULDER - 2+ VIEW COMPARISON:  None Available. FINDINGS: No acute fracture or dislocation. 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 No definitive acute fracture is identified of the ribs. Joint spaces and alignment are maintained. No area of  erosion or osseous destruction. No unexpected radiopaque foreign body. Soft tissues are unremarkable. IMPRESSION: 1. No acute fracture or dislocation. Electronically Signed   By: Valentino Saxon M.D.   On: 02/07/2022 08:29    Procedures Procedures    Medications Ordered in ED Medications  acetaminophen (TYLENOL) tablet 1,000 mg (1,000 mg Oral Given 02/07/22 1001)    ED Course/ Medical Decision Making/ A&P                             Medical Decision Making Problems Addressed: Elevated blood pressure reading: acute illness or injury Fall on same level from slipping, tripping or stumbling, initial encounter: acute illness or injury with systemic symptoms that poses a threat to life or bodily functions Sprain of right shoulder, unspecified shoulder sprain type, initial encounter: acute illness or injury  Amount and/or Complexity of Data Reviewed External Data Reviewed: notes. Radiology: ordered and independent interpretation performed. Decision-making details documented in ED Course.  Risk OTC drugs. Prescription drug management.   Imaging ordered.   Pt drove self, took ibuprofen earlier. Acetaminophen po.  Reviewed nursing notes and prior charts for additional history.   Xrays reviewed/interpreted by me - no fx.   Discussed diff dx w pt including occult injury/fx, labrum tear, rotator cuff tear or sprain, other minor sprain, etc.    Shoulder immobilizer. Rec ortho f/u.  Return precautions provided.           Final Clinical Impression(s) / ED Diagnoses Final diagnoses:  None    Rx / DC Orders ED Discharge Orders     None         Lajean Saver, MD 02/07/22 1015

## 2022-06-01 IMAGING — MG MM BREAST BX W LOC DEV 1ST LESION IMAGE BX SPEC STEREO GUIDE*L*
6 of 11 series · 6 of 35 positions shown · non-contrast
Comparison: Previous exams.
COMPARISON: Previous exams.

Addendum:
CLINICAL DATA: Patient presents for stereotactic guided core biopsy
of breast calcifications.

EXAM:
LEFT BREAST STEREOTACTIC CORE NEEDLE BIOPSY

[L (1 of 5)]
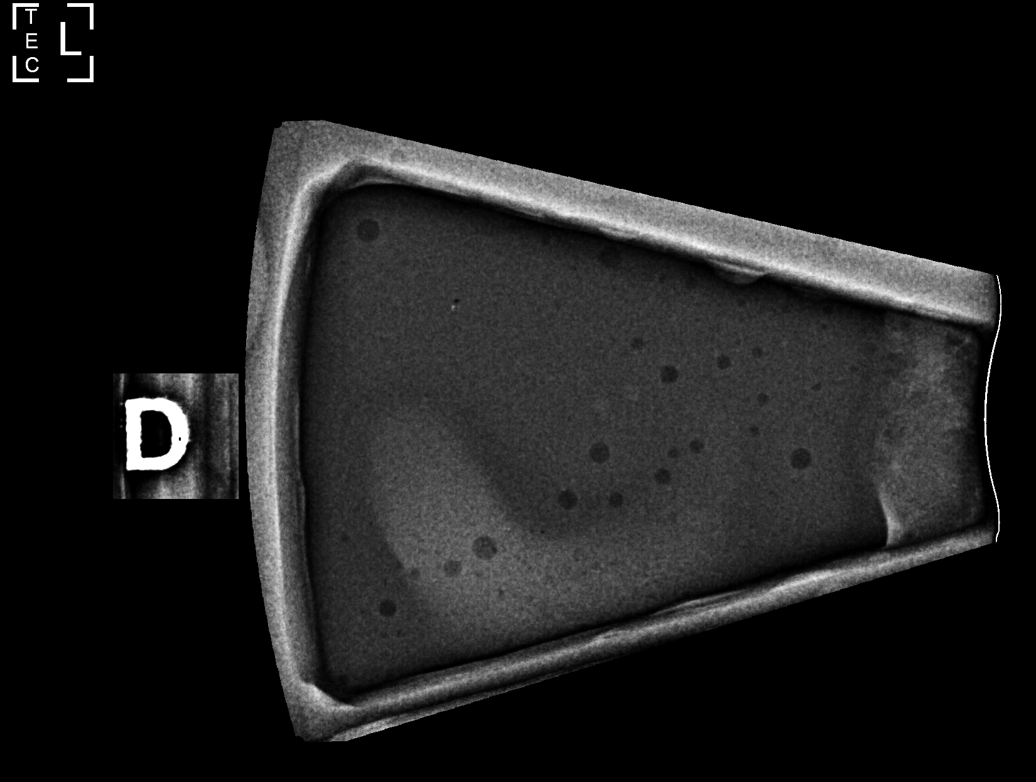

[L (2 of 5)]
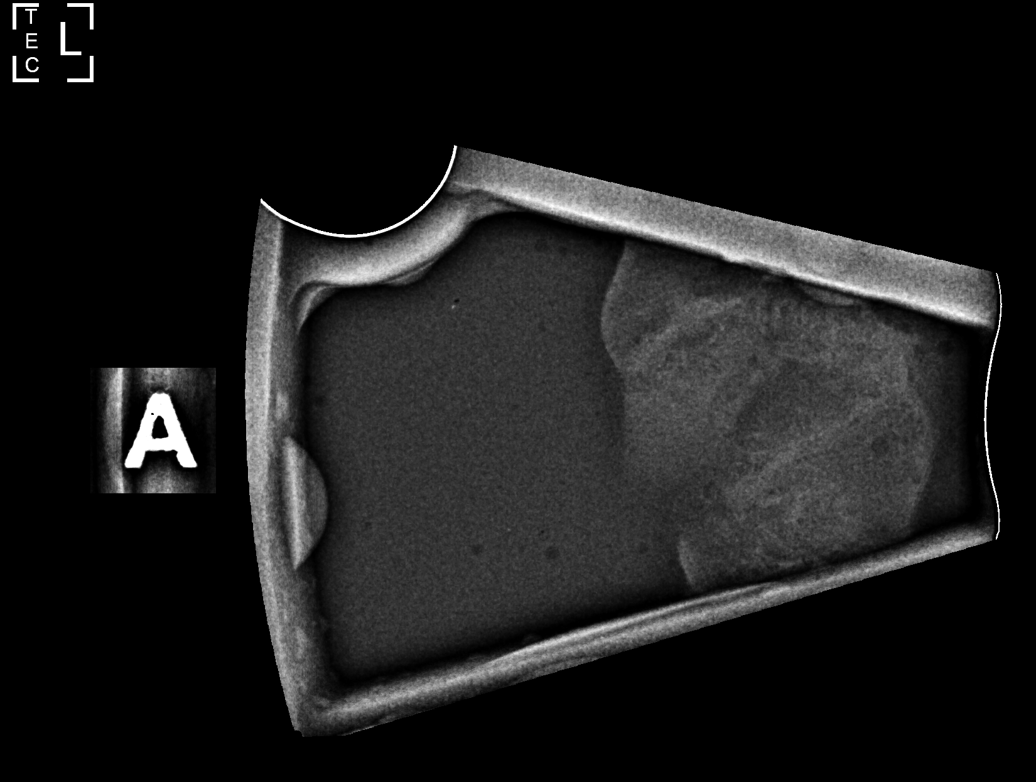

[L (3 of 5)]
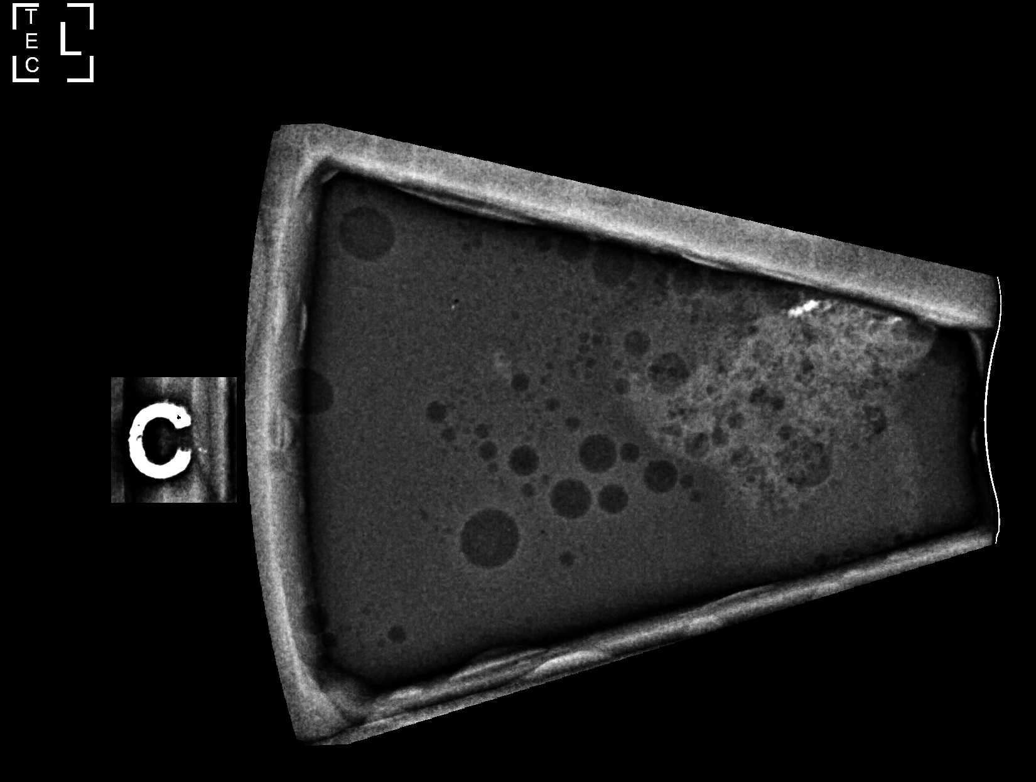

[L (4 of 5)]
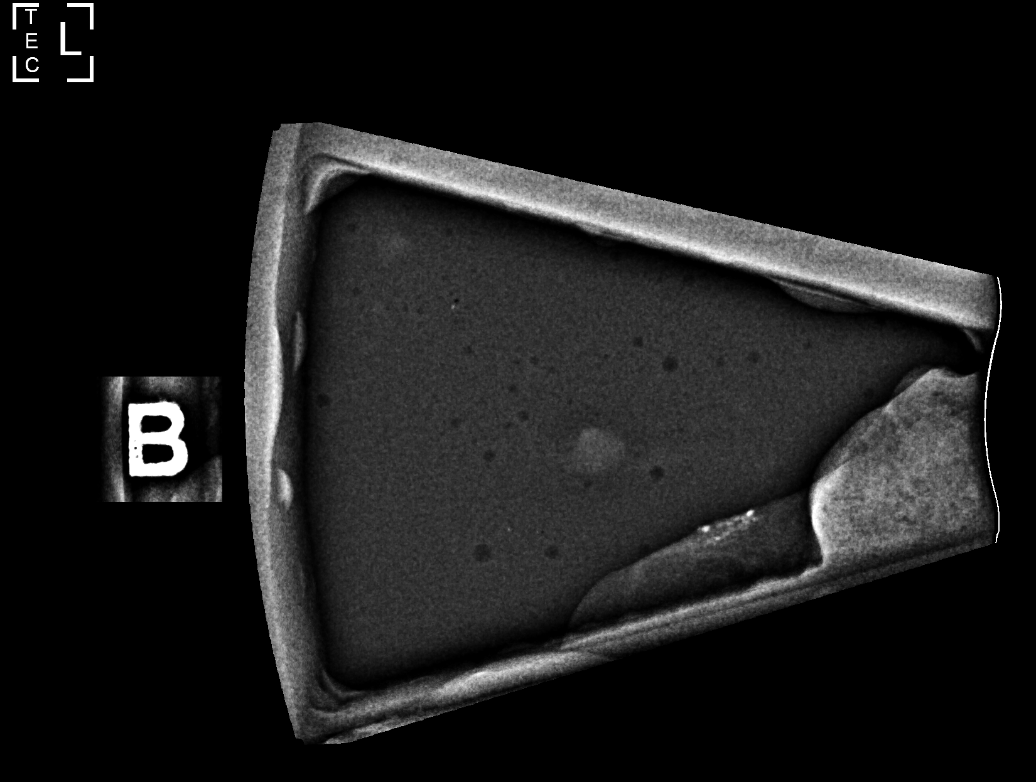

[L (5 of 5)]
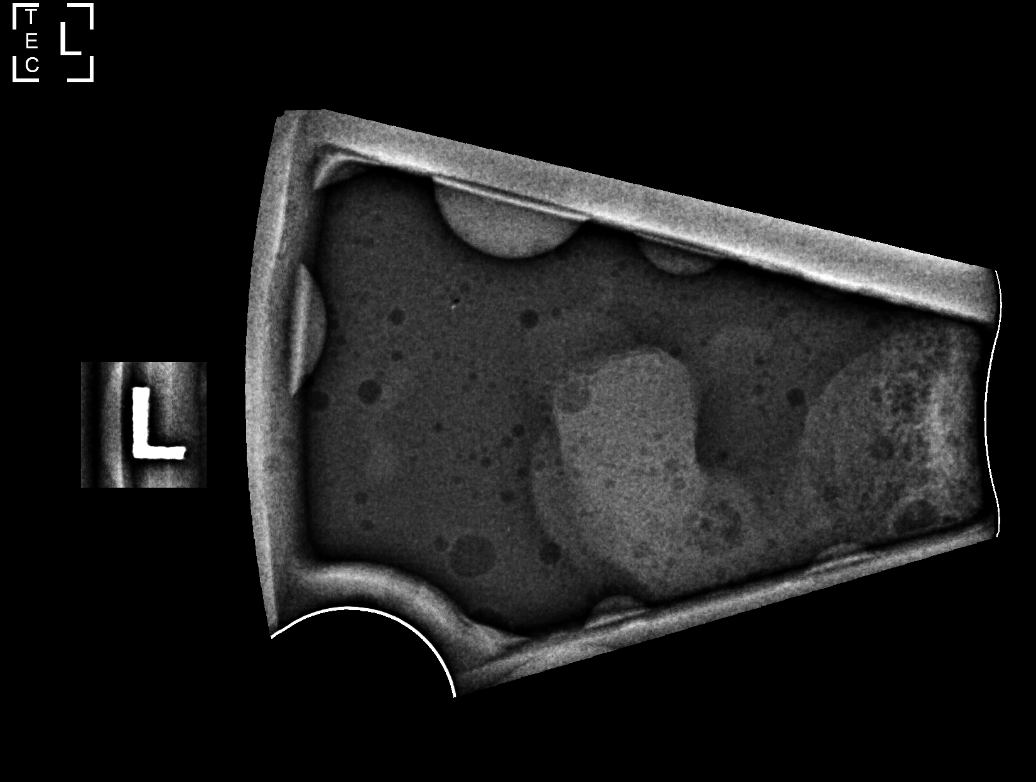

[L LM tomo · tomo slice 35/68.0]
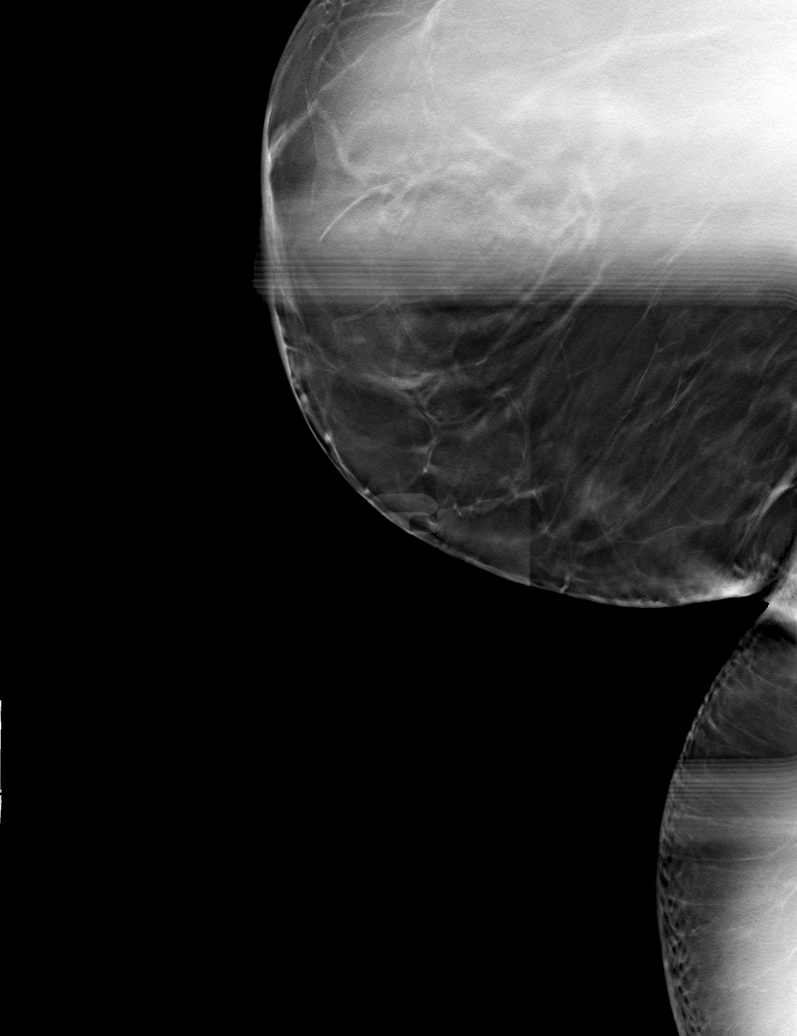

[6 of 35 positions shown; findings below may reference images not displayed]



Using sterile technique and 1% lidocaine and 1% lidocaine with
epinephrine as local anesthetic, under stereotactic guidance, a 9
gauge vacuum assisted device was used to perform core needle biopsy
of calcifications in the LOWER OUTER QUADRANT of the LEFT breast
using a LATERAL to me approach. Specimen radiograph was performed
showing calcifications in numerous tissue sample. Specimens with
calcifications are identified for pathology.

Lesion quadrant: LOWER OUTER QUADRANT LEFT breast

At the conclusion of the procedure, coil shaped tissue marker clip
was deployed into the biopsy cavity. Follow-up 2-view mammogram was
performed and dictated separately.
IMPRESSION: Stereotactic-guided biopsy of LEFT breast calcifications. No
apparent complications.

ADDENDUM:
Pathology revealed FAT NECROSIS WITH CALCIFICATIONS of the LEFT
breast, lower outer quadrant, (coil clip). This was found to be
concordant by Dr. Rene Felix Bazan.

Pathology results were discussed with the patient by telephone. The
patient reported doing well after the biopsy with bleeding and
tenderness at the site. Post biopsy instructions and care were
reviewed and questions were answered. The patient was encouraged to
call The [REDACTED] for any additional
concerns. My direct phone number was provided.

The patient was instructed to return for annual screening
mammography in January 2022.

Pathology results reported by Bello Muhammed Areola, RN on 02/25/2021.



Using sterile technique and 1% lidocaine and 1% lidocaine with
epinephrine as local anesthetic, under stereotactic guidance, a 9
gauge vacuum assisted device was used to perform core needle biopsy
of calcifications in the LOWER OUTER QUADRANT of the LEFT breast
using a LATERAL to me approach. Specimen radiograph was performed
showing calcifications in numerous tissue sample. Specimens with
calcifications are identified for pathology.

Lesion quadrant: LOWER OUTER QUADRANT LEFT breast

At the conclusion of the procedure, coil shaped tissue marker clip
was deployed into the biopsy cavity. Follow-up 2-view mammogram was
performed and dictated separately.
IMPRESSION: Stereotactic-guided biopsy of LEFT breast calcifications. No
apparent complications.

## 2022-07-25 ENCOUNTER — Emergency Department (HOSPITAL_COMMUNITY): Payer: Managed Care, Other (non HMO)

## 2022-07-25 ENCOUNTER — Encounter (HOSPITAL_COMMUNITY): Payer: Self-pay

## 2022-07-25 ENCOUNTER — Other Ambulatory Visit: Payer: Self-pay

## 2022-07-25 ENCOUNTER — Emergency Department (HOSPITAL_COMMUNITY)
Admission: EM | Admit: 2022-07-25 | Discharge: 2022-07-25 | Disposition: A | Discharge status: Home / Self Care | Payer: Managed Care, Other (non HMO) | Attending: Emergency Medicine | Admitting: Emergency Medicine

## 2022-07-25 DIAGNOSIS — S0003XA Contusion of scalp, initial encounter: Secondary | ICD-10-CM | POA: Diagnosis not present

## 2022-07-25 DIAGNOSIS — W19XXXA Unspecified fall, initial encounter: Secondary | ICD-10-CM | POA: Insufficient documentation

## 2022-07-25 DIAGNOSIS — M25511 Pain in right shoulder: Secondary | ICD-10-CM | POA: Diagnosis not present

## 2022-07-25 DIAGNOSIS — S0990XA Unspecified injury of head, initial encounter: Secondary | ICD-10-CM | POA: Diagnosis present

## 2022-07-25 MED ORDER — ACETAMINOPHEN 500 MG PO TABS
1000.0000 mg | ORAL_TABLET | Freq: Once | ORAL | Status: AC
  Administered 2022-07-25: 1000 mg via ORAL
  Filled 2022-07-25: qty 2

## 2022-07-25 MED ORDER — METHOCARBAMOL 500 MG PO TABS: 500.0000 mg | ORAL_TABLET | Freq: Two times a day (BID) | ORAL | 0 refills | Status: AC

## 2022-07-25 NOTE — Discharge Instructions (Addendum)
As we discussed, your workup in the ER today was reassuring for acute findings.  CT imaging of your head did not reveal any emergent concerns.  I recommend that you go home and get plenty of rest, drink plenty of fluids and take Tylenol/ibuprofen as needed for pain.  I have also given you a prescription for Robaxin which is a muscle relaxer for you to take as prescribed as needed.  Do not drive or operate heavy machinery while taking this medication as it can be sedating.  Please be aware that you may be more sore in the morning as that is to be expected.  Please use a heating pad on areas that are sore as this may help as well.  Follow-up with your primary care doctor as needed.  Return if development of any new or worsening symptoms.

## 2022-07-25 NOTE — ED Triage Notes (Signed)
Pt. Arrives POV with son for a fall in the shower about 20 mins ago. Pt. Hit the back of her head. She also c/o shoulder pain. Pt. Denies LOC. She does not take any medications.

## 2022-07-25 NOTE — ED Provider Notes (Signed)
[place] EMERGENCY DEPARTMENT AT [facility] Provider Note   CSN: [id number] Arrival date & time: 07/25/22  1939     History  Chief Complaint  Patient presents with   Colleen Bailey    Colleen Bailey is a 57 y.o. female.  Patient with history of hyperlipidemia presents today with complaints of fall. She states that same occurred immediately prior to arrival today when she slipped in the shower and hit her head. She did not loose consciousness. She is not anticoagulated. She notes a headache from same. Also notes some soreness in her right shoulder as well but is able to move it without issue. She is able to walk without issue.  The history is provided by the patient. No language interpreter was used.  Fall Associated symptoms include headaches.       Home Medications Prior to Admission medications   Medication Sig Start Date End Date Taking? Authorizing Provider  ondansetron (ZOFRAN) 4 MG tablet Take 1 tablet (4 mg total) by mouth every 8 (eight) hours as needed for nausea or vomiting. 11/15/19   Scheeler, Kermit Balo, PA-C  traMADol (ULTRAM) 50 MG tablet Take 1 tablet (50 mg total) by mouth every 6 (six) hours as needed. 02/07/22   Cathren Laine, MD      Allergies    Patient has no known allergies.    Review of Systems   Review of Systems  Neurological:  Positive for headaches.  All other systems reviewed and are negative.   Physical Exam Updated Vital Signs BP (!) 145/95   Pulse 71   Temp 98.4 F (36.9 C) (Oral)   Resp 16   Ht 5' (1.524 m)   Wt 59 kg   LMP 10/23/2015 Comment: tubal ligation  SpO2 99%   BMI 25.39 kg/m  Physical Exam Vitals and nursing note reviewed.  Constitutional:      General: She is not in acute distress.    Appearance: Normal appearance. She is normal weight. She is not ill-appearing, toxic-appearing or diaphoretic.  HENT:     Head: Normocephalic and atraumatic.     Comments: Small hematoma noted to the left parietal scalp. No  crepitus or deformity, wounds. Eyes:     Extraocular Movements: Extraocular movements intact.     Pupils: Pupils are equal, round, and reactive to light.  Cardiovascular:     Rate and Rhythm: Normal rate.  Pulmonary:     Effort: Pulmonary effort is normal. No respiratory distress.  Abdominal:     General: Abdomen is flat.     Palpations: Abdomen is soft.     Tenderness: There is no abdominal tenderness.  Musculoskeletal:        General: Normal range of motion.     Cervical back: Normal and normal range of motion.     Thoracic back: Normal.     Lumbar back: Normal.     Comments: No midline tenderness, no stepoffs or deformity noted on palpation of cervical, thoracic, and lumbar spine  Muscle soreness noted to the musculature of the right shoulder area. ROM intact without pain. Radial pulse intact and 2+. No deformity.   Patient observed to be ambulatory with steady gait.  Skin:    General: Skin is warm and dry.  Neurological:     General: No focal deficit present.     Mental Status: She is alert and oriented to person, place, and time.  Psychiatric:        Mood and Affect: Mood normal.  Behavior: Behavior normal.     ED Results / Procedures / Treatments   Labs (all labs ordered are listed, but only abnormal results are displayed) Labs Reviewed - No data to display  EKG None  Radiology CT Head Wo Contrast  Result Date: 07/25/2022 CLINICAL DATA:  Status post fall. EXAM: CT HEAD WITHOUT CONTRAST TECHNIQUE: Contiguous axial images were obtained from the base of the skull through the vertex without intravenous contrast. RADIATION DOSE REDUCTION: This exam was performed according to the departmental dose-optimization program which includes automated exposure control, adjustment of the mA and/or kV according to patient size and/or use of iterative reconstruction technique. COMPARISON:  November 10, 2018 FINDINGS: Brain: No evidence of acute infarction, hemorrhage,  hydrocephalus, extra-axial collection or mass lesion/mass effect. Vascular: No hyperdense vessel or unexpected calcification. Skull: Normal. Negative for fracture or focal lesion. Sinuses/Orbits: No acute finding. Other: None. IMPRESSION: No acute intracranial abnormality. Electronically Signed   By: Aram Candela M.D.   On: 07/25/2022 21:33    Procedures Procedures    Medications Ordered in ED Medications  acetaminophen (TYLENOL) tablet 1,000 mg (1,000 mg Oral Given 07/25/22 2042)    ED Course/ Medical Decision Making/ A&P                             Medical Decision Making Amount and/or Complexity of Data Reviewed Radiology: ordered.  Risk OTC drugs.   Patient presents today with complaints of fall immediately prior to arrival today.  She is afebrile, nontoxic-appearing, and in no acute distress with reassuring vital signs.  Physical exam reveals small hematoma to the left parietal scalp.  Also some soreness noted to the musculature of the right shoulder and neck area.  No focal bony tenderness or deformity.  ROM intact without pain.  Good distal pulses and sensation.  CT imaging obtained of the patient's head which has resulted and reveals no acute findings.  I have personally reviewed and interpreted this imaging and agree with radiology interpretation.  Patient without signs of serious head, neck, or back injury. No midline spinal tenderness or TTP of the chest or abd.  Normal neurological exam. No concern for closed head injury, lung injury, or intraabdominal injury. Normal muscle soreness after fall.    Patient is able to ambulate without difficulty in the ED.  Pt is hemodynamically stable, in NAD.   Pain has been managed & pt has no complaints prior to dc.  Patient counseled on typical course of muscle stiffness and soreness post-fall. Discussed s/s that should cause them to return. Patient instructed on NSAID use.  Will also give Robaxin for residual muscle soreness.  Instructed  that prescribed medicine can cause drowsiness and they should not work, drink alcohol, or drive while taking this medicine. Encouraged PCP follow-up for recheck if symptoms are not improved in one week.. Patient verbalized understanding and agreed with the plan. D/c to home in stable condition.  Final Clinical Impression(s) / ED Diagnoses Final diagnoses:  Fall, initial encounter    Rx / DC Orders ED Discharge Orders          Ordered    methocarbamol (ROBAXIN) 500 MG tablet  2 times daily        07/25/22 2233          An After Visit Summary was printed and given to the patient.     Vear Clock 07/25/22 2234    Charlynne Pander, MD  07/25/22 2244  

## 2023-07-02 ENCOUNTER — Emergency Department (HOSPITAL_COMMUNITY)
Admission: EM | Admit: 2023-07-02 | Discharge: 2023-07-02 | Disposition: A | Discharge status: Home / Self Care | Attending: Emergency Medicine | Admitting: Emergency Medicine

## 2023-07-02 ENCOUNTER — Emergency Department (HOSPITAL_COMMUNITY)

## 2023-07-02 ENCOUNTER — Encounter (HOSPITAL_COMMUNITY): Payer: Self-pay | Admitting: Emergency Medicine

## 2023-07-02 ENCOUNTER — Other Ambulatory Visit: Payer: Self-pay

## 2023-07-02 DIAGNOSIS — E119 Type 2 diabetes mellitus without complications: Secondary | ICD-10-CM | POA: Insufficient documentation

## 2023-07-02 DIAGNOSIS — R42 Dizziness and giddiness: Secondary | ICD-10-CM | POA: Diagnosis present

## 2023-07-02 DIAGNOSIS — H81399 Other peripheral vertigo, unspecified ear: Secondary | ICD-10-CM

## 2023-07-02 LAB — COMPREHENSIVE METABOLIC PANEL WITH GFR
ALT: 10 U/L (ref 0–44)
AST: 19 U/L (ref 15–41)
Albumin: 3.4 g/dL — ABNORMAL LOW (ref 3.5–5.0)
Alkaline Phosphatase: 89 U/L (ref 38–126)
Anion gap: 12 (ref 5–15)
BUN: 8 mg/dL (ref 6–20)
CO2: 22 mmol/L (ref 22–32)
Calcium: 9.3 mg/dL (ref 8.9–10.3)
Chloride: 99 mmol/L (ref 98–111)
Creatinine, Ser: 0.65 mg/dL (ref 0.44–1.00)
GFR, Estimated: >60 mL/min (ref >60)
Glucose, Bld: 437 mg/dL — ABNORMAL HIGH (ref 70–99)
Potassium: 4.1 mmol/L (ref 3.5–5.1)
Sodium: 133 mmol/L — ABNORMAL LOW (ref 135–145)
Total Bilirubin: 0.6 mg/dL (ref 0.0–1.2)
Total Protein: 6.5 g/dL (ref 6.5–8.1)

## 2023-07-02 LAB — CBC
HCT: 38 % (ref 36.0–46.0)
Hemoglobin: 12.2 g/dL (ref 12.0–15.0)
MCH: 26.6 pg (ref 26.0–34.0)
MCHC: 32.1 g/dL (ref 30.0–36.0)
MCV: 82.8 fL (ref 80.0–100.0)
Platelets: 304 10*3/uL (ref 150–400)
RBC: 4.59 MIL/uL (ref 3.87–5.11)
RDW: 11.9 % (ref 11.5–15.5)
WBC: 6.1 10*3/uL (ref 4.0–10.5)
nRBC: 0 % (ref 0.0–0.2)

## 2023-07-02 LAB — DIFFERENTIAL
Abs Immature Granulocytes: 0.04 10*3/uL (ref 0.00–0.07)
Basophils Absolute: 0 10*3/uL (ref 0.0–0.1)
Basophils Relative: 1 %
Eosinophils Absolute: 0.1 10*3/uL (ref 0.0–0.5)
Eosinophils Relative: 2 %
Immature Granulocytes: 1 %
Lymphocytes Relative: 25 %
Lymphs Abs: 1.5 10*3/uL (ref 0.7–4.0)
Monocytes Absolute: 0.4 10*3/uL (ref 0.1–1.0)
Monocytes Relative: 6 %
Neutro Abs: 4 10*3/uL (ref 1.7–7.7)
Neutrophils Relative %: 65 %

## 2023-07-02 MED ORDER — MECLIZINE HCL 25 MG PO TABS
25.0000 mg | ORAL_TABLET | Freq: Once | ORAL | Status: AC
  Administered 2023-07-02: 25 mg via ORAL
  Filled 2023-07-02: qty 1

## 2023-07-02 MED ORDER — ONDANSETRON 4 MG PO TBDP: 4.0000 mg | ORAL_TABLET | Freq: Three times a day (TID) | ORAL | 0 refills | Status: AC | PRN

## 2023-07-02 MED ORDER — MECLIZINE HCL 25 MG PO TABS
25.0000 mg | ORAL_TABLET | Freq: Three times a day (TID) | ORAL | 0 refills | Status: AC | PRN
Start: 2023-07-02 — End: ?

## 2023-07-02 MED ORDER — ONDANSETRON 4 MG PO TBDP
4.0000 mg | ORAL_TABLET | Freq: Once | ORAL | Status: AC
  Administered 2023-07-02: 4 mg via ORAL
  Filled 2023-07-02: qty 1

## 2023-07-02 NOTE — Discharge Instructions (Addendum)
 Take meclizine  as needed for dizziness.  Take Zofran  as needed for nausea and vomiting.  Follow-up with your primary care doctor and your doctor.  Return if symptoms worsen.

## 2023-07-02 NOTE — ED Triage Notes (Signed)
 Pt reports dizziness that started Tuesday. Pt reports that dizziness is worse when standing. Pt denies unilateral weakness or trouble with speech. Pt also reports pain in the left ear.

## 2023-07-02 NOTE — ED Provider Notes (Signed)
 [place] EMERGENCY DEPARTMENT AT [facility] Provider Note   CSN: [id number] Arrival date & time: 07/02/23  1032     Patient presents with: Dizziness   Colleen Bailey is a 58 y.o. female.   Patient here with intermittent dizziness the last couple days.  She is having ringing in her left ear.  History of vertigo.  Does not take medicine for high blood pressure cholesterol diabetes.  She denies any headache.  She denies any difficulty with speech vision.  When she walks sometimes she will feel dizzy a little bit off balance but not too bad.  She is having pain and ringing in her ear especially in the left ear.  She denies any headache.  Denies any fever or chills.  English is her second language but she does not want an interpreter.  Overall symptoms come and go.  Worse with head movement.  Worse when she stands up.  She gets nauseous at times.  No chest pain no shortness of breath.  The history is provided by the patient.       Prior to Admission medications   Medication Sig Start Date End Date Taking? Authorizing Provider  meclizine  (ANTIVERT ) 25 MG tablet Take 1 tablet (25 mg total) by mouth 3 (three) times daily as needed for dizziness. 07/02/23  Yes Vishnu Moeller, DO  ondansetron  (ZOFRAN -ODT) 4 MG disintegrating tablet Take 1 tablet (4 mg total) by mouth every 8 (eight) hours as needed for nausea or vomiting. 07/02/23  Yes Zurie Platas, DO  methocarbamol  (ROBAXIN ) 500 MG tablet Take 1 tablet (500 mg total) by mouth 2 (two) times daily. 07/25/22   Smoot, Sarah A, PA-C  ondansetron  (ZOFRAN ) 4 MG tablet Take 1 tablet (4 mg total) by mouth every 8 (eight) hours as needed for nausea or vomiting. 11/15/19   Scheeler, Janalyn Me, PA-C  traMADol  (ULTRAM ) 50 MG tablet Take 1 tablet (50 mg total) by mouth every 6 (six) hours as needed. 02/07/22   Steinl, Kevin, MD    Allergies: Patient has no known allergies.    Review of Systems  Updated Vital Signs BP (!) 167/85 (BP Location:  Left Arm)   Pulse 71   Temp 97.9 F (36.6 C)   Resp 17   LMP 10/23/2015 Comment: tubal ligation  SpO2 99%   Physical Exam Vitals and nursing note reviewed.  Constitutional:      General: She is not in acute distress.    Appearance: She is well-developed. She is not ill-appearing.  HENT:     Head: Normocephalic and atraumatic.     Right Ear: Tympanic membrane normal.     Left Ear: Tympanic membrane normal.     Nose: Nose normal.     Mouth/Throat:     Mouth: Mucous membranes are moist.   Eyes:     Extraocular Movements: Extraocular movements intact.     Conjunctiva/sclera: Conjunctivae normal.     Pupils: Pupils are equal, round, and reactive to light.    Cardiovascular:     Rate and Rhythm: Normal rate and regular rhythm.     Pulses: Normal pulses.     Heart sounds: Normal heart sounds. No murmur heard. Pulmonary:     Effort: Pulmonary effort is normal. No respiratory distress.     Breath sounds: Normal breath sounds.  Abdominal:     Palpations: Abdomen is soft.     Tenderness: There is no abdominal tenderness.   Musculoskeletal:        General: No  swelling.     Cervical back: Normal range of motion and neck supple.   Skin:    General: Skin is warm and dry.     Capillary Refill: Capillary refill takes less than 2 seconds.   Neurological:     General: No focal deficit present.     Mental Status: She is alert and oriented to person, place, and time.     Cranial Nerves: No cranial nerve deficit.     Sensory: No sensory deficit.     Motor: No weakness.     Coordination: Coordination normal.     Gait: Gait normal.     Comments: Steady gait, 5+ out of 5 strength, normal sensation, no drift, normal finger-to-nose finger, normal speech, normal visual fields  Psychiatric:        Mood and Affect: Mood normal.     (all labs ordered are listed, but only abnormal results are displayed) Labs Reviewed  COMPREHENSIVE METABOLIC PANEL WITH GFR - Abnormal; Notable for the  following components:      Result Value   Sodium 133 (*)    Glucose, Bld 437 (*)    Albumin 3.4 (*)    All other components within normal limits  CBC  DIFFERENTIAL    EKG: EKG Interpretation Date/Time:  Thursday July 02 2023 19:12:52 EDT Ventricular Rate:  67 PR Interval:  168 QRS Duration:  80 QT Interval:  394 QTC Calculation: 416 R Axis:   24  Text Interpretation: Normal sinus rhythm Minimal voltage criteria for LVH, may be normal variant ( R in aVL ) When compared with ECG of 10-Nov-2018 08:46, PREVIOUS ECG IS PRESENT Confirmed by Lowery Rue [id number] on 07/02/2023 7:23:45 PM  Radiology: CT HEAD WO CONTRAST Result Date: 07/02/2023 CLINICAL DATA:  58 year old female with dizziness onset 2 days ago, worse when standing. Left ear pain. EXAM: CT HEAD WITHOUT CONTRAST TECHNIQUE: Contiguous axial images were obtained from the base of the skull through the vertex without intravenous contrast. RADIATION DOSE REDUCTION: This exam was performed according to the departmental dose-optimization program which includes automated exposure control, adjustment of the mA and/or kV according to patient size and/or use of iterative reconstruction technique. COMPARISON:  Head CT 07/25/2022. FINDINGS: Brain: Cerebral volume remains normal. No midline shift, ventriculomegaly, mass effect, evidence of mass lesion, intracranial hemorrhage or evidence of cortically based acute infarction. Gray-white matter differentiation is within normal limits throughout the brain. Vascular: No suspicious intracranial vascular hyperdensity. Mild Calcified atherosclerosis at the skull base. Skull: Intact.  No acute osseous abnormality identified. Sinuses/Orbits: Bilateral tympanic cavities and mastoids are clear. Mild paranasal sinus mucosal thickening not significantly changed from last year. Other: Visualized orbits and scalp soft tissues are within normal limits. IMPRESSION: 1. Stable and normal for age noncontrast CT  appearance of the brain. 2. Middle ears and mastoids are clear. Mild paranasal sinus inflammation not significantly changed from last year. Electronically Signed   By: Marlise Simpers M.D.   On: 07/02/2023 12:08     Procedures   Medications Ordered in the ED - No data to display                                  Medical Decision Making Amount and/or Complexity of Data Reviewed Labs: ordered. Radiology: ordered.  Risk Prescription drug management.   Colleen Bailey is here with ringing in the left ear, intermittent dizziness.  Unremarkable vitals.  No fever.  Differential  diagnosis likely peripheral vertigo.  Have no concern for stroke.  There is no sign of ear infection on exam.  She has normal neurological exam.  Normal visual fields.  Normal gait.  I do not think that this is a cardiac or pulmonary process.  Will check electrolytes check for anemia check EKG.  Will give meclizine  and Zofran .  Labs and head CT done while patient was in triage.  Per my review interpretation of labs no significant leukocytosis anemia or electrolyte abnormality or kidney injury.  Head CT without any acute findings.  Overall I do think that this is likely a peripheral vertigo.  Will prescribe her meclizine  and Zofran .  Will have her follow-up with primary care and ENT.  Told to return if symptoms worsen.  At this time I have no concern for stroke or other emergent process.  EKG shows sinus rhythm.  No ischemic changes per my review interpretation.  This chart was dictated using voice recognition software.  Despite best efforts to proofread,  errors can occur which can change the documentation meaning.      Final diagnoses:  Peripheral vertigo, unspecified laterality    ED Discharge Orders          Ordered    meclizine  (ANTIVERT ) 25 MG tablet  3 times daily PRN        07/02/23 1924    ondansetron  (ZOFRAN -ODT) 4 MG disintegrating tablet  Every 8 hours PRN        07/02/23 1924                Lowery Rue, DO 07/02/23 1934

## 2023-08-21 ENCOUNTER — Ambulatory Visit (INDEPENDENT_AMBULATORY_CARE_PROVIDER_SITE_OTHER): Admitting: Audiology

## 2023-08-21 ENCOUNTER — Institutional Professional Consult (permissible substitution) (INDEPENDENT_AMBULATORY_CARE_PROVIDER_SITE_OTHER): Admitting: Physician Assistant
